# Patient Record
Sex: Female | Born: 1945 | Race: White | Hispanic: No | Marital: Married | Smoking: Former smoker
Health system: Southern US, Community
[De-identification: ages and names within clinical notes are randomized; demographics above are authoritative.]

## PROBLEM LIST (undated history)

## (undated) DIAGNOSIS — M810 Age-related osteoporosis without current pathological fracture: Secondary | ICD-10-CM

## (undated) DIAGNOSIS — I10 Essential (primary) hypertension: Secondary | ICD-10-CM

## (undated) DIAGNOSIS — E041 Nontoxic single thyroid nodule: Secondary | ICD-10-CM

## (undated) DIAGNOSIS — I341 Nonrheumatic mitral (valve) prolapse: Secondary | ICD-10-CM

## (undated) DIAGNOSIS — IMO0002 Reserved for concepts with insufficient information to code with codable children: Secondary | ICD-10-CM

## (undated) HISTORY — DX: Nonrheumatic mitral (valve) prolapse: I34.1

## (undated) HISTORY — PX: TUBAL LIGATION: SHX77

## (undated) HISTORY — PX: BREAST BIOPSY: SHX20

## (undated) HISTORY — DX: Essential (primary) hypertension: I10

## (undated) HISTORY — DX: Nontoxic single thyroid nodule: E04.1

## (undated) HISTORY — DX: Age-related osteoporosis without current pathological fracture: M81.0

## (undated) HISTORY — PX: OTHER SURGICAL HISTORY: SHX169

## (undated) HISTORY — DX: Reserved for concepts with insufficient information to code with codable children: IMO0002

---

## 1979-08-30 HISTORY — PX: VAGINAL HYSTERECTOMY: SUR661

## 2000-06-15 DIAGNOSIS — IMO0002 Reserved for concepts with insufficient information to code with codable children: Secondary | ICD-10-CM

## 2000-06-15 HISTORY — DX: Reserved for concepts with insufficient information to code with codable children: IMO0002

## 2003-02-08 ENCOUNTER — Encounter: Payer: Self-pay | Admitting: Neurosurgery

## 2003-02-10 ENCOUNTER — Inpatient Hospital Stay (HOSPITAL_COMMUNITY): Admission: RE | Admit: 2003-02-10 | Discharge: 2003-02-13 | Payer: Self-pay | Admitting: Neurosurgery

## 2003-02-10 ENCOUNTER — Encounter: Payer: Self-pay | Admitting: Neurosurgery

## 2003-12-28 ENCOUNTER — Emergency Department (HOSPITAL_COMMUNITY): Admission: EM | Admit: 2003-12-28 | Discharge: 2003-12-29 | Payer: Self-pay | Admitting: Emergency Medicine

## 2006-12-29 LAB — HM COLONOSCOPY: HM Colonoscopy: NORMAL

## 2008-02-28 ENCOUNTER — Emergency Department (HOSPITAL_COMMUNITY): Admission: EM | Admit: 2008-02-28 | Discharge: 2008-02-28 | Payer: Self-pay | Admitting: Family Medicine

## 2008-12-29 LAB — HM DEXA SCAN: HM Dexa Scan: NORMAL

## 2010-07-29 LAB — HM MAMMOGRAPHY: HM Mammogram: NORMAL

## 2011-05-16 NOTE — Op Note (Signed)
Tammy Bowen, BRAZZLE                      ACCOUNT NO.:  [id number]   MEDICAL RECORD NO.:  [id number]                   PATIENT TYPE:  INP   LOCATION:  2858                                 FACILITY:  [facility]   PHYSICIAN:  Danae Orleans. Venetia Maxon, M.D.               DATE OF BIRTH:  01-Jun-1946   DATE OF PROCEDURE:  02/10/2003  DATE OF DISCHARGE:                                 OPERATIVE REPORT   PREOPERATIVE DIAGNOSES:  Recurrent disk herniation L5-S1, left with  spondylolisthesis, degenerative disk disease and lumbar radiculopathy.   POSTOPERATIVE DIAGNOSES:  Recurrent disk herniation L5-S1, left with  spondylolisthesis, degenerative disk disease and lumbar radiculopathy.   PROCEDURE:  1. Redo diskectomy L5-S1.  2. Transverse lumbar interbody fusion L5-S1 with 9 mm Synthes bone allograft     and morcellized autograft.  3. Pedicle screw fixation L5-S1 bilaterally.  4. Posterolateral arthrodesis with morcellized autograft and Vitoss.   SURGEON:  Danae Orleans. Venetia Maxon, M.D.   ASSISTANT:  Clydene Fake, M.D.   ANESTHESIA:  General endotracheal.   ESTIMATED BLOOD LOSS:  150 cc.   COMPLICATIONS:  None.   DISPOSITION:  Recovery.   INDICATIONS FOR PROCEDURE:  Marnette Perkins is a 65 year old woman with a  recurrent disk herniation at L5-S1 on the left with marked spondylosis,  retrolisthesis of L5-S1 which increases between flexion and extension views.  It was elected to take her to surgery for decompression and fusion.   DESCRIPTION OF PROCEDURE:  Ms. Hemmelgarn is brought to the operating room.  Following the satisfactory and uncomplicated induction of general  endotracheal anesthesia and placement of intravenous lines and Foley  catheter, she was placed in a prone position on the operating room table  were the low back was then prepped and draped in the usual sterile fashion.  The area of the planned incision was infiltrated with 0.25% Marcaine and  0.5% lidocaine with 1:200,000  epinephrine. An incision was made over the  previous incision, extended cephalad and caudad. Old [place] sutures were  removed, bilateral subperiosteal dissection was performed exposing the L5-S1  interspace bilaterally as well as the transverse processes of L5 and S1.  Intraoperative x-ray confirmed correct orientation. Self retaining  retractors placed to facilitate exposure. A total laminectomy at L5 was then  performed decompressing the L5 nerve root and the lateral thecal sac which  was densely scarred in. Decompression was done under loupe magnification.  There was a large amount of free fragment of disk material directly abutting  the L5 nerve root and the lateral edge of the thecal sac causing significant  nerve root compression. This material was removed resulting in significant  decompression of the nerve roots but with removal of the spondylitic  overgrown bone. A partial laminectomy was performed on the right and a  laminar spreader was then placed and subsequently a more complete diskectomy  was performed on the left and using intraoperative fluoroscopy, a 9  mm  Synthes transverse lumbar interbody bone graft was inserted and counter sunk  appropriately. Then additional morcellized bone autograft was placed  overlying the skin plant and was tamped into position. Subsequently pedicle  screws were placed, two at L5 and two at S1 using AP and lateral fluoroscopy  to confirm trajectories. A 40 mm x 5.75 mm screws were placed at S1 and 45  mm x 7.5 mm SD-90 screws were placed at L5. All screws had excellent  purchase and no lateral cut outs were identified. The morcellized bone  autograft along with Vitoss had mixed with bone and blood aspirate from the  drilling and pedicles. This was then inserted in the posterolateral region  overlying the decorticated transverse processes of L5 and sacral ala  bilaterally. Hemostasis was then assured. The self retaining retractor was  removed. The  lumbodorsal fascia was closed with #1 Vicryl suture, the  subcutaneous tissue was reapproximated with 2-0 Vicryl interrupted inverted  sutures and skin edges reapproximated with interrupted 3-0 Vicryl stitches.  The wound was dressed with Benzoin and Steri-Strips, Telfa, gauze and tape.  The patient was extubated in the operating room and taken to the recovery  room in stable satisfactory condition having tolerated the operation well.  Counts were correct at the end of the case.                                               Danae Orleans. Venetia Maxon, M.D.    JDS/MEDQ  D:  02/10/2003  T:  02/10/2003  Job:  [id number]

## 2011-05-16 NOTE — Discharge Summary (Signed)
   NAMEDAWNYEL, LEVEN                      ACCOUNT NO.:  [id number]   MEDICAL RECORD NO.:  [id number]                   PATIENT TYPE:  INP   LOCATION:  3040                                 FACILITY:  [facility]   PHYSICIAN:  Danae Orleans. Venetia Maxon, M.D.               DATE OF BIRTH:  [birth year]   DATE OF ADMISSION:  02/10/2003  DATE OF DISCHARGE:  02/13/2003                                 DISCHARGE SUMMARY   REASON FOR ADMISSION:  Lumbar disk herniation, lumbar spondylolisthesis,  degenerative disk disease, recurrent disk herniation, and mitral valve  prolapse.   FINAL DIAGNOSES:  Lumbar disk herniation, lumbar spondylolisthesis,  degenerative disk disease, recurrent disk herniation, and mitral valve  prolapse.   HOSPITAL COURSE:  The patient is a 65 year old woman with recurrent disk  herniation at L5-S1 on the left with spondylolisthesis, degenerative disk  disease, and lumbar radiculopathy. She is admitted for same-day procedure  basis and underwent decompression and fusion at the L5-S1 levels.  Postoperatively, she did well with significant reduction in pain. She was  gradually mobilized after surgery and was eventually discharged home in  stable and satisfactory condition having tolerated her operation and  hospitalization well.   DISCHARGE MEDICATIONS:  Percocet one to two every four hours as needed for  pain, Paxil 10 mg p.o. every eight hours as needed for spasms.   FOLLOW UP:  Follow up with Dr. Venetia Maxon in three weeks.                                               Danae Orleans. Venetia Maxon, M.D.    JDS/MEDQ  D:  03/10/2003  T:  03/11/2003  Job:  [id number]

## 2013-05-05 ENCOUNTER — Encounter: Payer: Self-pay | Admitting: *Deleted

## 2013-05-09 ENCOUNTER — Encounter: Payer: Self-pay | Admitting: Obstetrics and Gynecology

## 2013-05-09 ENCOUNTER — Ambulatory Visit (INDEPENDENT_AMBULATORY_CARE_PROVIDER_SITE_OTHER): Payer: Medicare Other | Admitting: Obstetrics and Gynecology

## 2013-05-09 VITALS — BP 122/70 | Ht 62.0 in | Wt 111.0 lb

## 2013-05-09 DIAGNOSIS — Z01419 Encounter for gynecological examination (general) (routine) without abnormal findings: Secondary | ICD-10-CM

## 2013-05-09 DIAGNOSIS — Z124 Encounter for screening for malignant neoplasm of cervix: Secondary | ICD-10-CM

## 2013-05-09 MED ORDER — ESTRADIOL 0.1 MG/GM VA CREA: TOPICAL_CREAM | VAGINAL | Status: DC

## 2013-05-09 NOTE — Addendum Note (Signed)
Addended by: Alison Murray on: 05/09/2013 01:55 PM   Modules accepted: Orders

## 2013-05-09 NOTE — Progress Notes (Addendum)
67 y.o.  Married  Caucasian female   G3P0011 here for annual exam.  Using #3 ring with support for cystocele. Takes it out every night and really only wears it during part of the day.  Is very happy with the relief she gets from the pelvic pressure when she uses the pessary.    No LMP recorded. Patient is postmenopausal.          Sexually active: yes  The current method of family planning is tubal ligation, status post hysterectomy and post menopausal status.    Exercising: walking Last mammogram:  07/29/12 normal Last pap smear: not sure History of abnormal pap: no Smoking:quit 40 years ago Alcohol: 1 glass of wine at night, occ beer Last colonoscopy:2008 normal repeat in 10years Last Bone Density:  12/30/11 osteopenia Last tetanus shot: not sure done at Dr. Trey Sailors office Last cholesterol check: 2013 normal  Hgb:    pcp           Urine: pcp    Health Maintenance  Topic Date Due  . Tetanus/tdap  10/22/1965  . Zostavax  10/22/2006  . Pneumococcal Polysaccharide Vaccine Age 19 And Over  10/23/2011  . Mammogram  07/29/2012  . Influenza Vaccine  08/29/2013  . Colonoscopy  12/29/2016    Family History  Problem Relation Age of Onset  . Thyroid disease Sister   . Hypertension Maternal Aunt     There are no active problems to display for this patient.   Past Medical History  Diagnosis Date  . Hypertension   . MVP (mitral valve prolapse)   . Cystocele 06/15/00    Past Surgical History  Procedure Laterality Date  . Vaginal hysterectomy  1980's    Prolapsa  . Back fusion  2004, 1990    lumbar  . Breast biopsy Left   . Tubal ligation      BTSP    Allergies: Review of patient's allergies indicates no known allergies.  Current Outpatient Prescriptions  Medication Sig Dispense Refill  . atenolol (TENORMIN) 50 MG tablet Take 50 mg by mouth daily.      . benazepril-hydrochlorthiazide (LOTENSIN HCT) 10-12.5 MG per tablet Take 1 tablet by mouth daily.      Marland Kitchen CALCIUM PO  Take 800 mg by mouth daily.       . Cholecalciferol (VITAMIN D PO) Take 2,000 Units by mouth daily.      Marland Kitchen estradiol (VIVELLE-DOT) 0.05 MG/24HR Place 2 patches onto the skin once a week.       No current facility-administered medications for this visit.    ROS: Pertinent items are noted in HPI.  Social Hx:  Married, two children, 4 grandchildren.  One child in [place], on in [place].  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    Exam:    BP 122/70  Ht 5\' 2"  (1.575 m)  Wt 111 lb (50.349 kg)  BMI 20.3 kg/m2   Wt Readings from Last 3 Encounters:  05/09/13 111 lb (50.349 kg)     Ht Readings from Last 3 Encounters:  05/09/13 5\' 2"  (1.575 m)    General appearance: alert, cooperative and appears stated age Head: Normocephalic, without obvious abnormality, atraumatic Neck: no adenopathy, supple, symmetrical, trachea midline and thyroid not enlarged, symmetric, no tenderness/mass/nodules Lungs: clear to auscultation bilaterally Breasts: Inspection negative, No nipple retraction or dimpling, No nipple discharge or bleeding, No axillary or supraclavicular adenopathy, Normal to palpation without dominant masses Heart:  regular rate and rhythm Abdomen: soft, non-tender; bowel sounds normal; no masses,  no organomegaly Extremities: extremities normal, atraumatic, no cyanosis or edema Skin: Skin color, texture, turgor normal. No rashes or lesions Lymph nodes: Cervical, supraclavicular, and axillary nodes normal. No abnormal inguinal nodes palpated Neurologic: Grossly normal   Pelvic: External genitalia:  no lesions              Urethra:  normal appearing urethra with no masses, tenderness or lesions              Bartholins and Skenes: normal                 Vagina: normal appearing vagina with normal color and discharge, no lesions, no excoriations from pessary.  Pessary removed for exam and then reinserted.  Cystocele not prominent on this exam.                 Cervix: absent              Pap taken: no        Bimanual Exam:  Uterus:  absent                                      Adnexa: normal adnexa in size, nontender and no masses                                      Rectovaginal: Confirms                                      Anus:  normal sphincter tone, no lesions  A: normal menopausal exam, on ERT     Cystocele, using #3 ring with support very succussfully     S/p [facility]     P: mammogram counseled on breast self exam, mammography screening, adequate intake of calcium and vitamin D, diet and exercise return annually or prn   Cont E2 orally 0.5 mg - gets rx from Dr. Andrey Campanile Will change premarin cream to Estrace Cream due to insurance - use with pessary, 1/2 g tiw rx sent to [facility]  An After Visit Summary was printed and given to the patient.

## 2014-05-10 ENCOUNTER — Ambulatory Visit: Payer: Medicare Other | Admitting: Obstetrics and Gynecology

## 2014-05-10 ENCOUNTER — Ambulatory Visit (INDEPENDENT_AMBULATORY_CARE_PROVIDER_SITE_OTHER): Payer: Medicare Other | Admitting: Obstetrics and Gynecology

## 2014-05-10 ENCOUNTER — Encounter: Payer: Self-pay | Admitting: Obstetrics and Gynecology

## 2014-05-10 VITALS — BP 124/80 | HR 70 | Ht 62.5 in | Wt 109.4 lb

## 2014-05-10 DIAGNOSIS — IMO0002 Reserved for concepts with insufficient information to code with codable children: Secondary | ICD-10-CM

## 2014-05-10 DIAGNOSIS — M949 Disorder of cartilage, unspecified: Secondary | ICD-10-CM

## 2014-05-10 DIAGNOSIS — Z01419 Encounter for gynecological examination (general) (routine) without abnormal findings: Secondary | ICD-10-CM

## 2014-05-10 DIAGNOSIS — Z0000 Encounter for general adult medical examination without abnormal findings: Secondary | ICD-10-CM

## 2014-05-10 DIAGNOSIS — M8580 Other specified disorders of bone density and structure, unspecified site: Secondary | ICD-10-CM

## 2014-05-10 DIAGNOSIS — I10 Essential (primary) hypertension: Secondary | ICD-10-CM

## 2014-05-10 DIAGNOSIS — N8111 Cystocele, midline: Secondary | ICD-10-CM

## 2014-05-10 DIAGNOSIS — Z124 Encounter for screening for malignant neoplasm of cervix: Secondary | ICD-10-CM

## 2014-05-10 DIAGNOSIS — M899 Disorder of bone, unspecified: Secondary | ICD-10-CM

## 2014-05-10 LAB — HEMOGLOBIN, FINGERSTICK: Hemoglobin, fingerstick: 12.1 g/dL (ref 12.0–16.0)

## 2014-05-10 NOTE — Progress Notes (Signed)
Patient ID: Tammy Bowen, female   DOB: 22-Aug-1946, 68 y.o.   MRN: [id number] GYNECOLOGY VISIT  PCP:   Kathryne Eriksson, MD  Referring provider:   HPI: 68 y.o.   Married  Caucasian  female   G3P0011 with Patient's last menstrual period was 12/30/1983.   here for  AEX. Had a hysterectomy for prolapse.  Uses pessary #3 ring with support for cystocele. Pessary working well.  Little urinary incontinence if sneezes or jogging. No real Kegel exercises. No pad use.   Using Estrace vaginal cream infrequently and takes oral Estrace.  Oral Estrace is from Dr. Redmond Pulling.  Has refills. Does not need refills or Estrace cream.  Using KY jelly.  No real hot flashes.  Hgb:    12.1 Urine:  -  GYNECOLOGIC HISTORY: Patient's last menstrual period was 12/30/1983. Sexually active:  yes Partner preference: female Contraception:  Hysterectomy  Menopausal hormone therapy: Estradiol , Estrace cream DES exposure:  no  Blood transfusions:  Uncertain--may have received blood transfusion with a surgery. Sexually transmitted diseases:  no  GYN procedures and prior surgeries:  Hysterectomy for prolapse, D & C due to miscarriage Last mammogram:  2013 PPI:RJJOA.             Last pap and high risk HPV testing:  unsure  History of abnormal pap smear:  no   OB History   Grav Para Term Preterm Abortions TAB SAB Ect Mult Living   3 2   1 1    1        LIFESTYLE: Exercise:   Yoga and walking            Tobacco:   no Alcohol:    7 drinks per week Drug use:  no  OTHER HEALTH MAINTENANCE: Tetanus/TDap:   Up to date through PCP Gardisil:              n/a Influenza:            Never d/t allergy to Thimerosal Zostavax:            Up to date with PCP  Bone density:     12/2011 Osteopenia:[facility] Colonoscopy:     2008 wnl Dr. Benson [place]:  Next colonoscopy due 2018.  Cholesterol check:  1-2 year ago wnl.  Family History  Problem Relation Age of Onset  . Thyroid disease Sister   . Hypertension Maternal Aunt   .  Stroke Mother     There are no active problems to display for this patient.  Past Medical History  Diagnosis Date  . Hypertension   . MVP (mitral valve prolapse)   . Cystocele 06/15/00    Past Surgical History  Procedure Laterality Date  . Vaginal hysterectomy  1980's    Prolapsa  . Back fusion  2004, 1990    lumbar  . Breast biopsy Left   . Tubal ligation      BTSP    ALLERGIES: Thimerosal  Current Outpatient Prescriptions  Medication Sig Dispense Refill  . atenolol (TENORMIN) 50 MG tablet Take 50 mg by mouth daily.      . benazepril-hydrochlorthiazide (LOTENSIN HCT) 10-12.5 MG per tablet Take 1 tablet by mouth daily.      Marland Kitchen CALCIUM PO Take 800 mg by mouth daily.       . Cholecalciferol (VITAMIN D PO) Take 2,000 Units by mouth daily.      Marland Kitchen estradiol (ESTRACE) 0.1 MG/GM vaginal cream Use 1/2 g vaginally every night for the first 2  weeks, then use 1/2 g vaginally two or three times per week  42.5 g  6  . estradiol (ESTRACE) 0.5 MG tablet Take 0.5 mg by mouth daily.       No current facility-administered medications for this visit.     ROS:  Pertinent items are noted in HPI.  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   PHYSICAL EXAMINATION:    BP 124/80  Pulse 70  Ht 5' 2.5" (1.588 m)  Wt 109 lb 6.4 oz (49.624 kg)  BMI 19.68 kg/m2  LMP 12/30/1983   Wt Readings from Last 3 Encounters:  05/10/14 109 lb 6.4 oz (49.624 kg)  05/09/13 111 lb (50.349 kg)     Ht Readings from Last 3 Encounters:  05/10/14 5' 2.5" (1.588 m)  05/09/13 5\' 2"  (1.575 m)    General appearance: alert, cooperative and appears stated age Head: Normocephalic, without obvious abnormality, atraumatic Neck: no adenopathy, supple, symmetrical, trachea midline and thyroid not enlarged, symmetric, no tenderness/mass/nodules Lungs: clear to auscultation bilaterally Breasts: Inspection negative, No nipple retraction or dimpling, No nipple discharge or bleeding, No axillary or  supraclavicular adenopathy, Normal to palpation without dominant masses Heart: regular rate and rhythm Abdomen: soft, non-tender; no masses,  no organomegaly Extremities: extremities normal, atraumatic, no cyanosis or edema Skin: Skin color, texture, turgor normal. No rashes or lesions Lymph nodes: Cervical, supraclavicular, and axillary nodes normal. No abnormal inguinal nodes palpated Neurologic: Grossly normal  Pelvic: External genitalia:  no lesions              Urethra:  normal appearing urethra with no masses, tenderness or lesions              Bartholins and Skenes: normal                 Vagina: normal appearing vagina with normal color and discharge, no lesions, third degree cystocele, first degree vault prolapse, first degree rectocele              Cervix: absent              Pap and high risk HPV testing done: no.            Bimanual Exam:  Uterus:  uterus is normal size, shape, consistency and nontender                                      Adnexa: normal adnexa in size, nontender and no masses                                      Rectovaginal: Confirms                                      Anus:  normal sphincter tone, no lesions  Pessary removed, cleansed, replaced.  ASSESSMENT  Normal gynecologic exam. Pelvic organ prolapse. Pessary use. Estrogen therapy patient.   PLAN  Mammogram recommended yearly.  Patient overdue and will schedule with [facility].  Will schedule osteopenia for [facility] also.  Pap smear and high risk HPV testing not indicated.  Continue pessary use.  Discussion of prolapse and ACOG handout on prolapse.  I discussed estrogen therapy - benefits and risks including breast cancer,  DVT, PE, MI, Stroke.  She will get mammogram and bone density done and will consider weaning off. Counseled on self breast exam, Calcium and vitamin D intake, exercise. Return annually or prn   An After Visit Summary was printed and given to the patient.

## 2014-05-10 NOTE — Patient Instructions (Signed)

## 2014-05-11 LAB — COMPREHENSIVE METABOLIC PANEL
ALK PHOS: 44 U/L (ref 39–117)
ALT: 13 U/L (ref 0–35)
AST: 21 U/L (ref 0–37)
Albumin: 3.9 g/dL (ref 3.5–5.2)
BILIRUBIN TOTAL: 0.4 mg/dL (ref 0.2–1.2)
BUN: 18 mg/dL (ref 6–23)
CO2: 29 meq/L (ref 19–32)
Calcium: 8.8 mg/dL (ref 8.4–10.5)
Chloride: 102 mEq/L (ref 96–112)
Creat: 0.93 mg/dL (ref 0.50–1.10)
Glucose, Bld: 95 mg/dL (ref 70–99)
Potassium: 4.2 mEq/L (ref 3.5–5.3)
SODIUM: 139 meq/L (ref 135–145)
TOTAL PROTEIN: 7.1 g/dL (ref 6.0–8.3)

## 2014-05-11 LAB — CBC
HCT: 37.5 % (ref 36.0–46.0)
Hemoglobin: 12.5 g/dL (ref 12.0–15.0)
MCH: 29.6 pg (ref 26.0–34.0)
MCHC: 33.3 g/dL (ref 30.0–36.0)
MCV: 88.7 fL (ref 78.0–100.0)
PLATELETS: 265 10*3/uL (ref 150–400)
RBC: 4.23 MIL/uL (ref 3.87–5.11)
RDW: 14.3 % (ref 11.5–15.5)
WBC: 4.5 10*3/uL (ref 4.0–10.5)

## 2014-05-11 LAB — LIPID PANEL
Cholesterol: 200 mg/dL (ref 0–200)
HDL: 75 mg/dL (ref >39)
LDL CALC: 100 mg/dL — AB (ref 0–99)
Total CHOL/HDL Ratio: 2.7 Ratio
Triglycerides: 124 mg/dL (ref <150)
VLDL: 25 mg/dL (ref 0–40)

## 2014-05-12 LAB — TSH: TSH: 2.381 u[IU]/mL (ref 0.350–4.500)

## 2014-08-24 ENCOUNTER — Telehealth: Payer: Self-pay

## 2014-08-24 NOTE — Telephone Encounter (Signed)
Called patient and notified received BMD results.  Still will osteoporosis but stable since last study.  Will repeat in 2 years.  Per Dr. Quincy Simmonds continue with current therapy: Estrogen, calcium/vit.D/exercise.

## 2014-09-11 ENCOUNTER — Encounter: Payer: Self-pay | Admitting: Obstetrics and Gynecology

## 2014-10-30 ENCOUNTER — Encounter: Payer: Self-pay | Admitting: Obstetrics and Gynecology

## 2014-11-06 ENCOUNTER — Telehealth: Payer: Self-pay | Admitting: Obstetrics and Gynecology

## 2014-11-06 NOTE — Telephone Encounter (Signed)
Routing to Dr. Silva to review and advise.  

## 2014-11-06 NOTE — Telephone Encounter (Signed)
Pt states Dr. Quincy Simmonds told her she would give her some recommendations for her and her husband  for a primary care physician.

## 2014-11-06 NOTE — Telephone Encounter (Signed)
I may have mentioned [employer] as a possibility.

## 2014-11-09 NOTE — Telephone Encounter (Signed)
Called patient and information given about [facility] and number given.  Will close encounter.

## 2015-05-16 ENCOUNTER — Ambulatory Visit: Payer: Medicare Other | Admitting: Obstetrics and Gynecology

## 2015-05-25 ENCOUNTER — Encounter: Payer: Self-pay | Admitting: Obstetrics and Gynecology

## 2015-05-25 ENCOUNTER — Ambulatory Visit (INDEPENDENT_AMBULATORY_CARE_PROVIDER_SITE_OTHER): Payer: Medicare Other | Admitting: Obstetrics and Gynecology

## 2015-05-25 VITALS — BP 120/70 | HR 48 | Resp 14 | Ht 62.5 in | Wt 108.0 lb

## 2015-05-25 DIAGNOSIS — Z124 Encounter for screening for malignant neoplasm of cervix: Secondary | ICD-10-CM | POA: Diagnosis not present

## 2015-05-25 DIAGNOSIS — Z01419 Encounter for gynecological examination (general) (routine) without abnormal findings: Secondary | ICD-10-CM

## 2015-05-25 DIAGNOSIS — R319 Hematuria, unspecified: Secondary | ICD-10-CM

## 2015-05-25 DIAGNOSIS — Z0000 Encounter for general adult medical examination without abnormal findings: Secondary | ICD-10-CM

## 2015-05-25 LAB — POCT URINALYSIS DIPSTICK
Bilirubin, UA: NEGATIVE
GLUCOSE UA: NEGATIVE
Ketones, UA: NEGATIVE
LEUKOCYTES UA: NEGATIVE
Nitrite, UA: NEGATIVE
PH UA: 6
Protein, UA: NEGATIVE
Spec Grav, UA: 1.015
Urobilinogen, UA: NEGATIVE

## 2015-05-25 MED ORDER — ESTRADIOL 0.1 MG/GM VA CREA: TOPICAL_CREAM | VAGINAL | Status: DC

## 2015-05-25 NOTE — Patient Instructions (Signed)

## 2015-05-25 NOTE — Progress Notes (Signed)
Patient ID: Tammy Bowen, female   DOB: [birth year], 69 y.o.   MRN: [id number] 68 y.o. G27P0011 Married Caucasian female here for annual exam.    Has stable osteoporosis by bone density in 2015.   Has microscopic hematuria today.  Has pessary in place. No problems with it.  Removes every few days.  Has had work up in the past for UTIs.  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   Patient is on ERT.  Worried about stroke and broken bones.  Hesitant to take bisphonates.  Takes Estrace 0.5 mg and Estrace vaginal cream.   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   PCP:   Dr. Kathryne Eriksson  Patient's last menstrual period was 12/30/1983.          Sexually active: Yes.    The current method of family planning is diaphragm and status post hysterectomy.    Exercising: No.  The patient does not participate in regular exercise at present. Smoker:  no  Health Maintenance: Pap: Hysterectomy  History of abnormal Pap:  no MMG:  08-21-14 WNL Fibroglandular density- [facility] Colonoscopy: 2008 WNL repeat in 10 years BMD:   08-21-14 osteoporisis -stable TDaP: 2014  Screening Labs:  Hb today:   Urine today: ++ RBC   reports that she quit smoking about 42 years ago. Her smoking use included Cigars. She has never used smokeless tobacco. She reports that she drinks about 3.5 oz of alcohol per week. She reports that she does not use illicit drugs.  Past Medical History  Diagnosis Date  . Hypertension   . MVP (mitral valve prolapse)   . Cystocele 06/15/00    Past Surgical History  Procedure Laterality Date  . Vaginal hysterectomy  1980's    Prolapsa  . Back fusion  2004, 1990    lumbar  . Breast biopsy Left   . Tubal ligation      BTSP    Current Outpatient Prescriptions  Medication Sig Dispense Refill  . atenolol (TENORMIN) 50 MG tablet Take 50 mg by mouth daily.    . benazepril-hydrochlorthiazide (LOTENSIN HCT) 10-12.5 MG per tablet Take 1  tablet by mouth daily.    Marland Kitchen CALCIUM PO Take 800 mg by mouth daily.     . Cholecalciferol (VITAMIN D PO) Take 2,000 Units by mouth daily.    Marland Kitchen estradiol (ESTRACE) 0.1 MG/GM vaginal cream Use 1/2 g vaginally every night for the first 2 weeks, then use 1/2 g vaginally two or three times per week 42.5 g 6  . estradiol (ESTRACE) 0.5 MG tablet Take 0.5 mg by mouth daily.     No current facility-administered medications for this visit.    Family History  Problem Relation Age of Onset  . Thyroid disease Sister   . Hypertension Maternal Aunt   . Stroke Mother     ROS:  Pertinent items are noted in HPI.  Otherwise, a comprehensive ROS was negative.  Exam:   BP 120/70 mmHg  Pulse 48  Resp 14  Ht 5' 2.5" (1.588 m)  Wt 108 lb (48.988 kg)  BMI 19.43 kg/m2  LMP 12/30/1983    General appearance: alert, cooperative and appears stated age Head: Normocephalic, without obvious abnormality, atraumatic Neck: no adenopathy, supple, symmetrical, trachea midline and thyroid normal to inspection and palpation Lungs: clear to auscultation bilaterally Breasts: normal appearance, no masses or tenderness, Inspection negative, No nipple retraction or dimpling, No nipple discharge  or bleeding, No axillary or supraclavicular adenopathy Heart: regular rate and rhythm Abdomen: soft, non-tender; bowel sounds normal; no masses,  no organomegaly Extremities: extremities normal, atraumatic, no cyanosis or edema Skin: Skin color, texture, turgor normal. No rashes or lesions Lymph nodes: Cervical, supraclavicular, and axillary nodes normal. No abnormal inguinal nodes palpated Neurologic: Grossly normal  Pelvic: External genitalia:  no lesions              Urethra:  normal appearing urethra with no masses, tenderness or lesions              Bartholins and Skenes: normal                 Vagina: normal appearing vagina with normal color and discharge, no lesions.  No significant prolapse noted.  No ulceration. Minimal  white discharge.              Cervix: absent              Pap taken: No. Bimanual Exam:  Uterus:  uterus absent              Adnexa: no mass, fullness, tenderness              Rectovaginal: Yes.  .  Confirms.              Anus:  normal sphincter tone, no lesions  Pessary removed, cleansed, and replaced.   Chaperone was present for exam.  Assessment:   Well woman visit with normal exam. Microscopic hematuria. Pelvic organ prolapse. Pessary patient.  Status post total vaginal hysterectomy.  ERT.  Osteoporosis.   Plan: Yearly mammogram recommended after age 25.  Recommended self breast exam.  Pap and HR HPV as above. Discussed Calcium, Vitamin D, regular exercise program including cardiovascular and weight bearing exercise. Labs performed.  Yes.  .   See orders.  Check urine micro and culture.  Refills given on medications.  Yes.  .  See orders.  Will continue with vaginal estrogen cream.  Discussed risks of DVT, PE, MI, stroke, breast cancer.   She may stop oral estrogen.  Will discuss also with PCP.  No refills given at this time. Will continue pessary.  Declines medication for osteoporosis treatment.  Bone density in 2017.   Follow up annually and prn.     After visit summary provided.

## 2015-05-26 LAB — URINALYSIS, MICROSCOPIC ONLY
Bacteria, UA: NONE SEEN
Casts: NONE SEEN
Crystals: NONE SEEN
SQUAMOUS EPITHELIAL / LPF: NONE SEEN

## 2015-05-27 LAB — URINE CULTURE
Colony Count: NO GROWTH
ORGANISM ID, BACTERIA: NO GROWTH

## 2015-05-29 ENCOUNTER — Telehealth: Payer: Self-pay | Admitting: Obstetrics and Gynecology

## 2015-05-29 NOTE — Telephone Encounter (Signed)
Spoke with patient and message from Dr. Quincy Simmonds given. Patient agreeable. She will follow up prn. Routing to provider for final review. Patient agreeable to disposition. Will close encounter.

## 2015-05-29 NOTE — Telephone Encounter (Signed)
-----   Message from Nunzio Cobbs, MD sent at 05/27/2015 12:54 PM EDT ----- Please inform patient of negative and normal urine micro and culture. The urine dip in the office was a false positive.  She requested a phone call instead of a My Chart message.  Cc- Marisa Sprinkles

## 2015-05-29 NOTE — Telephone Encounter (Signed)
Patient was told to call today for results from urine culture results done 05/25/15.

## 2016-04-24 DIAGNOSIS — M21969 Unspecified acquired deformity of unspecified lower leg: Secondary | ICD-10-CM | POA: Insufficient documentation

## 2016-04-24 DIAGNOSIS — K2100 Gastro-esophageal reflux disease with esophagitis, without bleeding: Secondary | ICD-10-CM | POA: Insufficient documentation

## 2016-04-24 DIAGNOSIS — I059 Rheumatic mitral valve disease, unspecified: Secondary | ICD-10-CM | POA: Insufficient documentation

## 2016-04-24 DIAGNOSIS — M1990 Unspecified osteoarthritis, unspecified site: Secondary | ICD-10-CM | POA: Insufficient documentation

## 2016-04-24 DIAGNOSIS — I10 Essential (primary) hypertension: Secondary | ICD-10-CM | POA: Insufficient documentation

## 2016-04-25 ENCOUNTER — Other Ambulatory Visit: Payer: Self-pay | Admitting: Family Medicine

## 2016-04-25 DIAGNOSIS — E041 Nontoxic single thyroid nodule: Secondary | ICD-10-CM

## 2016-04-25 DIAGNOSIS — M8580 Other specified disorders of bone density and structure, unspecified site: Secondary | ICD-10-CM | POA: Insufficient documentation

## 2016-04-25 DIAGNOSIS — M67471 Ganglion, right ankle and foot: Secondary | ICD-10-CM | POA: Insufficient documentation

## 2016-05-06 ENCOUNTER — Ambulatory Visit
Admission: RE | Admit: 2016-05-06 | Discharge: 2016-05-06 | Disposition: A | Discharge status: Home / Self Care | Payer: Medicare Other | Source: Ambulatory Visit | Attending: Family Medicine | Admitting: Family Medicine

## 2016-05-06 DIAGNOSIS — E041 Nontoxic single thyroid nodule: Secondary | ICD-10-CM

## 2016-05-06 HISTORY — DX: Nontoxic single thyroid nodule: E04.1

## 2016-05-13 ENCOUNTER — Other Ambulatory Visit: Payer: Self-pay | Admitting: Family Medicine

## 2016-05-13 DIAGNOSIS — E041 Nontoxic single thyroid nodule: Secondary | ICD-10-CM

## 2016-05-21 ENCOUNTER — Other Ambulatory Visit: Payer: Self-pay | Admitting: Family Medicine

## 2016-05-21 DIAGNOSIS — E041 Nontoxic single thyroid nodule: Secondary | ICD-10-CM

## 2016-06-04 ENCOUNTER — Ambulatory Visit
Admission: RE | Admit: 2016-06-04 | Discharge: 2016-06-04 | Disposition: A | Discharge status: Home / Self Care | Payer: Medicare Other | Source: Ambulatory Visit | Attending: Family Medicine | Admitting: Family Medicine

## 2016-06-04 ENCOUNTER — Other Ambulatory Visit (HOSPITAL_COMMUNITY)
Admission: RE | Admit: 2016-06-04 | Discharge: 2016-06-04 | Disposition: A | Discharge status: Home / Self Care | Payer: Medicare Other | Source: Ambulatory Visit | Attending: Radiology | Admitting: Radiology

## 2016-06-04 DIAGNOSIS — E041 Nontoxic single thyroid nodule: Secondary | ICD-10-CM | POA: Diagnosis present

## 2016-06-06 ENCOUNTER — Ambulatory Visit (INDEPENDENT_AMBULATORY_CARE_PROVIDER_SITE_OTHER): Payer: Medicare Other | Admitting: Obstetrics and Gynecology

## 2016-06-06 ENCOUNTER — Encounter: Payer: Self-pay | Admitting: Obstetrics and Gynecology

## 2016-06-06 VITALS — BP 138/72 | HR 64 | Resp 14 | Ht 62.25 in | Wt 108.2 lb

## 2016-06-06 DIAGNOSIS — Z79899 Other long term (current) drug therapy: Secondary | ICD-10-CM | POA: Diagnosis not present

## 2016-06-06 DIAGNOSIS — Z01419 Encounter for gynecological examination (general) (routine) without abnormal findings: Secondary | ICD-10-CM | POA: Diagnosis not present

## 2016-06-06 DIAGNOSIS — M810 Age-related osteoporosis without current pathological fracture: Secondary | ICD-10-CM | POA: Diagnosis not present

## 2016-06-06 DIAGNOSIS — Z124 Encounter for screening for malignant neoplasm of cervix: Secondary | ICD-10-CM

## 2016-06-06 DIAGNOSIS — IMO0002 Reserved for concepts with insufficient information to code with codable children: Secondary | ICD-10-CM

## 2016-06-06 DIAGNOSIS — N8110 Cystocele, unspecified: Secondary | ICD-10-CM

## 2016-06-06 DIAGNOSIS — N816 Rectocele: Secondary | ICD-10-CM | POA: Diagnosis not present

## 2016-06-06 MED ORDER — ESTRADIOL 0.5 MG PO TABS: 0.5000 mg | ORAL_TABLET | Freq: Every day | ORAL | Status: DC

## 2016-06-06 NOTE — Progress Notes (Signed)
Patient ID: Tammy Bowen, female   DOB: 1946-08-08, 70 y.o.   MRN: [id number] 70 y.o. G49P0011 Married Caucasian female here for annual exam.    Brings in labs today from her PCP that will be scanned in.  Also had recent thyroid biopsy, and results are pending.   Ultrasound showed bilateral thyroid nodules. TSH normal.  Takes oral estrogen and uses estrogen cream (rarely)  Took pessary out today for this visit.  Leaves it in for a week at a time. Has some bleeding with pessary removal.   Has frequent urination.   Has osteoporosis and does not treat.  Due for a recheck on this.  Uses Fosamax many years ago.  Vit D 52 - 04/25/16.  Some shortness of breath with anxiety.  Son is deceased and concern about how his children are being raised.   Is now fully retired.   PCP:  Charlcie Cradle, MD   Patient's last menstrual period was 12/30/1983.           Sexually active: Yes.  female  The current method of family planning is status post hysterectomy.    Exercising: Yes.    walks 4days/week;1.5-27miles. Smoker:  no  Health Maintenance: Pap:  Unsure--Hysterectomy History of abnormal Pap:  no MMG:  08-21-14 Density B/Neg/biRads1:[facility] Colonoscopy:  2008 normal with Dr. Lenise Herald due 2018 BMD:   08-21-14  Result  Stable Osteoporosis:[facility] TDaP:  2014 Gardasil:   N/A  Screening Labs:  Hb today: PCP, Urine today: PCP   reports that she quit smoking about 43 years ago. Her smoking use included Cigars. She has never used smokeless tobacco. She reports that she drinks about 4.2 oz of alcohol per week. She reports that she does not use illicit drugs.  Past Medical History  Diagnosis Date  . Hypertension   . MVP (mitral valve prolapse)   . Cystocele 06/15/00  . Thyroid nodule 05/06/16    right and left thyroid nodule     Past Surgical History  Procedure Laterality Date  . Vaginal hysterectomy  1980's    Prolapsa  . Back fusion  2004, 1990    lumbar  . Breast biopsy Left   . Tubal  ligation      BTSP    Current Outpatient Prescriptions  Medication Sig Dispense Refill  . atenolol (TENORMIN) 50 MG tablet Take 50 mg by mouth daily.    . benazepril-hydrochlorthiazide (LOTENSIN HCT) 10-12.5 MG per tablet Take 1 tablet by mouth daily.    Marland Kitchen CALCIUM PO Take 800 mg by mouth daily.     . Cholecalciferol (VITAMIN D PO) Take 2,000 Units by mouth daily.    Marland Kitchen estradiol (ESTRACE) 0.1 MG/GM vaginal cream Use 1/2 g vaginally every night for the first 2 weeks, then use 1/2 g vaginally two or three times per week 42.5 g 2  . estradiol (ESTRACE) 0.5 MG tablet Take 0.5 mg by mouth daily.     No current facility-administered medications for this visit.    Family History  Problem Relation Age of Onset  . Thyroid disease Sister   . Hypertension Maternal Aunt   . Stroke Mother   . Breast cancer Other 44    ROS:  Pertinent items are noted in HPI.  Otherwise, a comprehensive ROS was negative.  Exam:   BP 138/72 mmHg  Pulse 64  Resp 14  Ht 5' 2.25" (1.581 m)  Wt 108 lb 3.2 oz (49.079 kg)  BMI 19.64 kg/m2  LMP 12/30/1983  General appearance: alert, cooperative and appears stated age Head: Normocephalic, without obvious abnormality, atraumatic Neck: no adenopathy, supple, symmetrical, trachea midline and thyroid normal to inspection and palpation Lungs: clear to auscultation bilaterally Breasts: normal appearance, no masses or tenderness, Inspection negative, No nipple retraction or dimpling, No nipple discharge or bleeding, No axillary or supraclavicular adenopathy Heart: regular rate and rhythm Abdomen: incisions:  Left inguinal, soft, non-tender; no masses, no organomegaly Extremities: extremities normal, atraumatic, no cyanosis or edema Skin: Skin color, texture, turgor normal. No rashes or lesions Lymph nodes: Cervical, supraclavicular, and axillary nodes normal. No abnormal inguinal nodes palpated Neurologic: Grossly normal  Pelvic: External genitalia:  no lesions               Urethra:  normal appearing urethra with no masses, tenderness or lesions              Bartholins and Skenes: normal                 Vagina: normal appearing vagina with normal color and discharge, no lesions.  Second degree cystocele and second degree rectocele.  Minimal vault prolapse.               Cervix: absent              Pap taken: No. Bimanual Exam:  Uterus:  uterus absent              Adnexa: normal adnexa and no mass, fullness, tenderness              Rectal exam: Yes.  .  Confirms.              Anus:  normal sphincter tone, no lesions  Chaperone was present for exam.  Assessment:   Well woman visit with normal exam. Pelvic organ prolapse. Pessary patient.  Status post total vaginal hysterectomy.  ERT.  Osteoporosis.  Status post recent thyroid biopsy. Has bilateral thyroid nodules. Shortness of breath. ? Etiology. Anxiety.  Plan: Yearly mammogram recommended after age 72.  Recommended self breast exam.  Pap and HR HPV as above. Discussed Calcium, Vitamin D, regular exercise program including cardiovascular and weight bearing exercise. Labs performed.  No. .    Prescription medication(s) given.  Yes.  .  See orders. I refilled Estrace 0.5 mg for one month only.  I discussed risks of DVT, PE, MI, stroke, and breast cancer.  Needs to update mammogram for further refills. She will get her mammogram up to date and also do her bone density.  She will contact her PCP regarding her shortness of breath/anxiety. Ok to continue pessary use. Follow up annually and prn.       After visit summary provided.

## 2016-06-24 ENCOUNTER — Encounter: Payer: Self-pay | Admitting: Obstetrics and Gynecology

## 2016-06-30 ENCOUNTER — Telehealth: Payer: Self-pay

## 2016-06-30 NOTE — Telephone Encounter (Signed)
Called patient to discuss BMD results per Dr. Quincy Simmonds.  Report still shows Osteoporosis.  Hips look stable(left hip actually a little better).  Spine looks improved and has Osteopenia as dx in this region.  Continue weight bearing exercise, calcium vitamin D and will repeat study in 2 years.  Patient thanked me for calling and said to thank Dr. Quincy Simmonds also. Report in to be scanned in [place] from [place].

## 2016-07-28 ENCOUNTER — Other Ambulatory Visit: Payer: Self-pay | Admitting: Obstetrics and Gynecology

## 2016-07-28 NOTE — Telephone Encounter (Signed)
Medication refill request: Estradiol   Last AEX:  06-06-16  Next AEX: 06-10-17  Last MMG (if hormonal medication request): 06-12-16 WNL Refill authorized: Please advise

## 2016-12-19 DIAGNOSIS — Z23 Encounter for immunization: Secondary | ICD-10-CM | POA: Insufficient documentation

## 2017-05-05 ENCOUNTER — Telehealth: Payer: Self-pay | Admitting: Obstetrics and Gynecology

## 2017-05-05 NOTE — Telephone Encounter (Signed)
Patient is having issues with her pessary.

## 2017-05-05 NOTE — Telephone Encounter (Signed)
Spoke with patient. Patient states she feels same pressure in bladder as before placement of pessary. Patient reports pessary still in place, unable to remove, bulging around. Patient states she is able to void, goes frequently. Reports occasional bleeding when pulling out pessary, no vaginal discharge or odor. Recommended OV for further evaluation, scheduled for 05/06/17 at 9:30am with Dr. Quincy Simmonds. Patient is agreeable to date and time.  Routing to provider for final review. Patient is agreeable to disposition. Will close encounter.

## 2017-05-06 ENCOUNTER — Ambulatory Visit (INDEPENDENT_AMBULATORY_CARE_PROVIDER_SITE_OTHER): Payer: Medicare Other | Admitting: Obstetrics and Gynecology

## 2017-05-06 ENCOUNTER — Encounter: Payer: Self-pay | Admitting: Obstetrics and Gynecology

## 2017-05-06 VITALS — BP 118/62 | HR 60 | Resp 16 | Wt 110.0 lb

## 2017-05-06 DIAGNOSIS — N8110 Cystocele, unspecified: Secondary | ICD-10-CM

## 2017-05-06 NOTE — Progress Notes (Signed)
GYNECOLOGY  VISIT   HPI: 71 y.o.   Married  Caucasian  female   G3P0011 with Patient's last menstrual period was 12/30/1983.   here for pessary problems. Per patient has had urinary pressure and discomfort for about 2 months. Worse at the end of the day per patient. Feels a bulge like before she had a pessary.  Some difficulty removing recently.  Now removing every few days with vaginal estrogen cream. Has worked well in general except for the las 2 - 3 months.  Some difficulty voiding.  Feel pressure to void and then cannot.  No dysuria.  No hematuria.  No having much urinary leakage like she did when she first started using it.  Can have a little bleeding with pessary removal. No vaginal discharge.  Able to have BMs.  Does use magnesium and calcium combination.   At last exam in 06/06/16, Second degree cystocele and second degree rectocele.  Minimal vault prolapse.   GYNECOLOGIC HISTORY: Patient's last menstrual period was 12/30/1983. Contraception:  Hysterectomy Menopausal hormone therapy:  Estrace Last mammogram:  06-12-16 3D Density B/Neg/BiRads1:[facility] Last pap smear:  Unsure        OB History    Gravida Para Term Preterm AB Living   3 2     1 1    SAB TAB Ectopic Multiple Live Births     1               There are no active problems to display for this patient.   Past Medical History:  Diagnosis Date  . Cystocele 06/15/00  . Hypertension   . MVP (mitral valve prolapse)   . Thyroid nodule 05/06/16   right and left thyroid nodule     Past Surgical History:  Procedure Laterality Date  . back fusion  2004, 1990   lumbar  . BREAST BIOPSY Left   . TUBAL LIGATION     BTSP  . VAGINAL HYSTERECTOMY  1980's   Prolapsa    Current Outpatient Prescriptions  Medication Sig Dispense Refill  . atenolol (TENORMIN) 50 MG tablet Take 50 mg by mouth daily.    . benazepril-hydrochlorthiazide (LOTENSIN HCT) 10-12.5 MG per tablet Take 1 tablet by mouth daily.    Marland Kitchen CALCIUM PO Take  800 mg by mouth daily.     . Cholecalciferol (VITAMIN D PO) Take 2,000 Units by mouth daily.    Marland Kitchen estradiol (ESTRACE) 0.1 MG/GM vaginal cream Use 1/2 g vaginally every night for the first 2 weeks, then use 1/2 g vaginally two or three times per week 42.5 g 2  . estradiol (ESTRACE) 0.5 MG tablet TAKE 1 TABLET ONCE DAILY. 30 tablet 10   No current facility-administered medications for this visit.      ALLERGIES: Thimerosal  Family History  Problem Relation Age of Onset  . Thyroid disease Sister   . Hypertension Maternal Aunt   . Stroke Mother   . Breast cancer Other 80    Social History   Social History  . Marital status: Married    Spouse name: N/A  . Number of children: N/A  . Years of education: N/A   Occupational History  . Not on file.   Social History Main Topics  . Smoking status: Former Smoker    Types: Cigars    Quit date: 05/09/1973  . Smokeless tobacco: Never Used  . Alcohol use 4.2 oz/week    7 Standard drinks or equivalent per week     Comment: a glass of  wine at night, occ beer  . Drug use: No  . Sexual activity: Yes    Partners: Male    Birth control/ protection: Post-menopausal, Surgical     Comment: TVH   Other Topics Concern  . Not on file   Social History Narrative  . No narrative on file    ROS:  Pertinent items are noted in HPI.  PHYSICAL EXAMINATION:    BP 118/62 (BP Location: Right Arm, Patient Position: Sitting, Cuff Size: Normal)   Pulse 60   Resp 16   Wt 110 lb (49.9 kg)   LMP 12/30/1983   BMI 19.96 kg/m     General appearance: alert, cooperative and appears stated age    Pelvic: External genitalia:  no lesions              Urethra:  normal appearing urethra with no masses, tenderness or lesions              Bartholins and Skenes: normal                 Vagina: normal appearing vagina with normal color and discharge, no lesions              Cervix:  Absent.                 Bimanual Exam:  Uterus:   Absent.               Adnexa: no mass, fullness, tenderness         Valsalva with her #3 ring with support pessary in place, has third degree cystocele. Valsalva with sample #4 ring with support, has third degree cystocele.  Valsalva with sample #5 ring with support, has urinary incontinence. Valsalva with sample #5 incontinence dish, no significant cystocele but can feel it.  No stress incontinence. Valsalva with sample #4 incontinence dish, no significant cystocele, and is comfortable.  No stress incontinence.   Chaperone was present for exam.  ASSESSMENT  Pelvic organ prolapse. Pessary patient.  Status post total vaginal hysterectomy.  ERT.    PLAN  Pessary cleansed and given to patient in a biobag.  We will order a #3 incontinence dish for the patient and have her return for this.  At her annual exam in June, we can do her pessary check.    An After Visit Summary was printed and given to the patient.  __15____ minutes face to face time of which over 50% was spent in counseling.

## 2017-05-15 ENCOUNTER — Encounter: Payer: Self-pay | Admitting: Obstetrics and Gynecology

## 2017-05-15 ENCOUNTER — Telehealth: Payer: Self-pay | Admitting: Obstetrics and Gynecology

## 2017-05-15 ENCOUNTER — Ambulatory Visit (INDEPENDENT_AMBULATORY_CARE_PROVIDER_SITE_OTHER): Payer: Medicare Other | Admitting: Obstetrics and Gynecology

## 2017-05-15 VITALS — BP 118/64 | HR 50 | Ht 62.25 in | Wt 108.0 lb

## 2017-05-15 DIAGNOSIS — N8110 Cystocele, unspecified: Secondary | ICD-10-CM

## 2017-05-15 NOTE — Telephone Encounter (Signed)
Return in about 10 days (around 05/25/2017) for pessary check - Dr. Quincy Simmonds

## 2017-05-15 NOTE — Progress Notes (Signed)
GYNECOLOGY  VISIT   HPI: 71 y.o.   Married  Caucasian  female   G3P0011 with Patient's last menstrual period was 12/30/1983.   here for pessary insertion.    We fitted her for a #3 incontinence dish.   GYNECOLOGIC HISTORY: Patient's last menstrual period was 12/30/1983. Contraception: Hysterectomy Menopausal hormone therapy:  Estrace vaginal cream Last mammogram: 06-12-16 3D Density B/Neg/BiRads1:[facility] Last pap smear:Unsure        OB History    Gravida Para Term Preterm AB Living   '3 2     1 1   ' SAB TAB Ectopic Multiple Live Births     1               There are no active problems to display for this patient.   Past Medical History:  Diagnosis Date  . Cystocele 06/15/00  . Hypertension   . MVP (mitral valve prolapse)   . Thyroid nodule 05/06/16   right and left thyroid nodule     Past Surgical History:  Procedure Laterality Date  . back fusion  2004, 1990   lumbar  . BREAST BIOPSY Left   . TUBAL LIGATION     BTSP  . VAGINAL HYSTERECTOMY  1980's   Prolapsa    Current Outpatient Prescriptions  Medication Sig Dispense Refill  . atenolol (TENORMIN) 50 MG tablet Take 50 mg by mouth daily.    . benazepril-hydrochlorthiazide (LOTENSIN HCT) 10-12.5 MG per tablet Take 1 tablet by mouth daily.    Marland Kitchen CALCIUM PO Take 800 mg by mouth daily.     . Cholecalciferol (VITAMIN D PO) Take 2,000 Units by mouth daily.    . cyclobenzaprine (FLEXERIL) 5 MG tablet Take 1 tablet by mouth at bedtime as needed.    Marland Kitchen estradiol (ESTRACE) 0.1 MG/GM vaginal cream Use 1/2 g vaginally every night for the first 2 weeks, then use 1/2 g vaginally two or three times per week 42.5 g 2  . estradiol (ESTRACE) 0.5 MG tablet TAKE 1 TABLET ONCE DAILY. 30 tablet 10   No current facility-administered medications for this visit.      ALLERGIES: Thimerosal  Family History  Problem Relation Age of Onset  . Thyroid disease Sister   . Stroke Mother   . Breast cancer Other 44  . Hypertension Maternal Aunt      Social History   Social History  . Marital status: Married    Spouse name: N/A  . Number of children: N/A  . Years of education: N/A   Occupational History  . Not on file.   Social History Main Topics  . Smoking status: Former Smoker    Types: Cigars    Quit date: 05/09/1973  . Smokeless tobacco: Never Used  . Alcohol use 4.2 oz/week    7 Standard drinks or equivalent per week     Comment: a glass of wine at night, occ beer  . Drug use: No  . Sexual activity: Yes    Partners: Male    Birth control/ protection: Post-menopausal, Surgical     Comment: TVH   Other Topics Concern  . Not on file   Social History Narrative  . No narrative on file    ROS:  Pertinent items are noted in HPI.  PHYSICAL EXAMINATION:    BP 118/64 (BP Location: Right Arm, Patient Position: Sitting, Cuff Size: Normal)   Pulse (!) 50   Ht 5' 2.25" (1.581 m)   Wt 108 lb (49 kg)   LMP  12/30/1983   BMI 19.60 kg/m     General appearance: alert, cooperative and appears stated age   Patient's ring #3 with support pessary removed, cleansed, and given to patient in a biobag.  Premier #3 incontinence dish fitted.  Lot number [id number].  Patient is able to place and remove.  She had some urinary incontinence with this.  We did retry the #4 incontinence dish and she could feel the pressure of this.  We did notice the difference in the size of the fitting kit and the actual product the patient receives.  The Premier pessary is actually smaller than the fitting kit sizes.   Chaperone was present for exam.  ASSESSMENT  Cystocele.  Stress incontinence.   PLAN  She will wear the incontinence dish #3.  Continue vaginal estrogen cream. Return in 10 days for a recheck.  Use a pad for protection.  If this is not satisfactory, she has the option to try a Premier #4 dish with support or consider surgical care also.   An After Visit Summary was printed and given to the patient.  _15_____ minutes  face to face time of which over 50% was spent in counseling.

## 2017-05-28 ENCOUNTER — Encounter: Payer: Self-pay | Admitting: Obstetrics and Gynecology

## 2017-05-28 ENCOUNTER — Ambulatory Visit (INDEPENDENT_AMBULATORY_CARE_PROVIDER_SITE_OTHER): Payer: Medicare Other | Admitting: Obstetrics and Gynecology

## 2017-05-28 VITALS — BP 110/60 | HR 64 | Resp 16 | Ht 62.25 in | Wt 108.0 lb

## 2017-05-28 DIAGNOSIS — N8110 Cystocele, unspecified: Secondary | ICD-10-CM

## 2017-05-28 MED ORDER — ESTRADIOL 0.1 MG/GM VA CREA: TOPICAL_CREAM | VAGINAL | 0 refills | Status: DC

## 2017-05-28 NOTE — Progress Notes (Signed)
GYNECOLOGY  VISIT   HPI: 71 y.o.   Married  Caucasian  female   G3P0011 with Patient's last menstrual period was 12/30/1983.   here for  Recheck pessary.  Doing better with the incontinence dish than with the former ring with support. Pessary can come out with a BM. No urinary leakage.  No vaginal drainage or bleeding.   Pessary is comfortable.  Needs refill of the Estrace cream.   GYNECOLOGIC HISTORY: Patient's last menstrual period was 12/30/1983. Contraception:  Hysterectomy Menopausal hormone therapy:  Estrace vaginal cream/tablet Last mammogram:   06-12-16 3D Density B/Neg/BiRads1:[facility] Last pap smear:   unsure        OB History    Gravida Para Term Preterm AB Living   3 2     1 1    SAB TAB Ectopic Multiple Live Births     1               There are no active problems to display for this patient.   Past Medical History:  Diagnosis Date  . Cystocele 06/15/00  . Hypertension   . MVP (mitral valve prolapse)   . Thyroid nodule 05/06/16   right and left thyroid nodule     Past Surgical History:  Procedure Laterality Date  . back fusion  2004, 1990   lumbar  . BREAST BIOPSY Left   . TUBAL LIGATION     BTSP  . VAGINAL HYSTERECTOMY  1980's   Prolapsa    Current Outpatient Prescriptions  Medication Sig Dispense Refill  . atenolol (TENORMIN) 50 MG tablet Take 50 mg by mouth daily.    . benazepril-hydrochlorthiazide (LOTENSIN HCT) 10-12.5 MG per tablet Take 1 tablet by mouth daily.    Marland Kitchen CALCIUM PO Take 800 mg by mouth daily.     . Cholecalciferol (VITAMIN D PO) Take 2,000 Units by mouth daily.    . cyclobenzaprine (FLEXERIL) 5 MG tablet Take 1 tablet by mouth at bedtime as needed.    Marland Kitchen estradiol (ESTRACE) 0.1 MG/GM vaginal cream Use 1/2 g vaginally every night for the first 2 weeks, then use 1/2 g vaginally two or three times per week 42.5 g 2  . estradiol (ESTRACE) 0.5 MG tablet TAKE 1 TABLET ONCE DAILY. 30 tablet 10   No current facility-administered  medications for this visit.      ALLERGIES: Thimerosal  Family History  Problem Relation Age of Onset  . Thyroid disease Sister   . Stroke Mother   . Breast cancer Other 44  . Hypertension Maternal Aunt     Social History   Social History  . Marital status: Married    Spouse name: N/A  . Number of children: N/A  . Years of education: N/A   Occupational History  . Not on file.   Social History Main Topics  . Smoking status: Former Smoker    Types: Cigars    Quit date: 05/09/1973  . Smokeless tobacco: Never Used  . Alcohol use 4.2 oz/week    7 Standard drinks or equivalent per week     Comment: a glass of wine at night, occ beer  . Drug use: No  . Sexual activity: Yes    Partners: Male    Birth control/ protection: Post-menopausal, Surgical     Comment: TVH   Other Topics Concern  . Not on file   Social History Narrative  . No narrative on file    ROS:  Pertinent items are noted in HPI.  PHYSICAL  EXAMINATION:    BP 110/60 (BP Location: Right Arm, Patient Position: Sitting, Cuff Size: Normal)   Pulse 64   Resp 16   Ht 5' 2.25" (1.581 m)   Wt 108 lb (49 kg)   LMP 12/30/1983   BMI 19.60 kg/m     General appearance: alert, cooperative and appears stated age    Pelvic: External genitalia:  no lesions              Urethra:  normal appearing urethra with no masses, tenderness or lesions              Bartholins and Skenes: normal                 Vagina: normal appearing vagina with normal color and discharge, no lesions.. Second degree cystocele and second degree rectocele.              Cervix: no lesions                Bimanual Exam:  Uterus:  normal size, contour, position, consistency, mobility, non-tender              Adnexa: no mass, fullness, tenderness        With Valsalva, she does have some bladder and rectum prolapse around the pessary. Pessary removed, cleansed, and replaced.   Chaperone was present for exam.  ASSESSMENT  Cystocele.   Rectocele.  Doing well w/ incontinence dish.  PLAN  Continue pessary use.  Refill of vaginal Estrace cream.  1/2 gm pv 2 - 3 times per week.  Can also use water based lubricants for vaginal hydration.  Return for annual exam in July.  I encouraged her to increase her activity further to test the limits of the pessary.  If it falls out, then we can order the #4 incontinence dish.    An After Visit Summary was printed and given to the patient.  __15____ minutes face to face time of which over 50% was spent in counseling.

## 2017-06-10 ENCOUNTER — Ambulatory Visit: Payer: Medicare Other | Admitting: Obstetrics and Gynecology

## 2017-06-29 ENCOUNTER — Ambulatory Visit: Payer: Medicare Other | Admitting: Obstetrics and Gynecology

## 2017-06-29 NOTE — Progress Notes (Deleted)
71 y.o. G48P0011 Married Caucasian female here for annual exam.    PCP:     Patient's last menstrual period was 12/30/1983.           Sexually active: {yes no:314532}  The current method of family planning is status post hysterectomy.    Exercising: {yes no:314532}  {types:19826} Smoker:  {YES NO:22349}  Health Maintenance: Pap:  Unsure History of abnormal Pap:  no MMG:  06/12/16 BIRADS 1 negative: [facility] Colonoscopy:  *** BMD:   06/12/16  Result:  Osteoporosis -- [facility] TDaP:  2014 Gardasil:   n/a HIV: Hep C: Screening Labs:  Hb today: ***, Urine today: ***   reports that she quit smoking about 44 years ago. Her smoking use included Cigars. She has never used smokeless tobacco. She reports that she drinks about 4.2 oz of alcohol per week . She reports that she does not use drugs.  Past Medical History:  Diagnosis Date  . Cystocele 06/15/00  . Hypertension   . MVP (mitral valve prolapse)   . Thyroid nodule 05/06/16   right and left thyroid nodule     Past Surgical History:  Procedure Laterality Date  . back fusion  2004, 1990   lumbar  . BREAST BIOPSY Left   . TUBAL LIGATION     BTSP  . VAGINAL HYSTERECTOMY  1980's   Prolapsa    Current Outpatient Prescriptions  Medication Sig Dispense Refill  . atenolol (TENORMIN) 50 MG tablet Take 50 mg by mouth daily.    . benazepril-hydrochlorthiazide (LOTENSIN HCT) 10-12.5 MG per tablet Take 1 tablet by mouth daily.    Marland Kitchen CALCIUM PO Take 800 mg by mouth daily.     . Cholecalciferol (VITAMIN D PO) Take 2,000 Units by mouth daily.    . cyclobenzaprine (FLEXERIL) 5 MG tablet Take 1 tablet by mouth at bedtime as needed.    Marland Kitchen estradiol (ESTRACE) 0.1 MG/GM vaginal cream Use 1/2 g vaginally two or three times per week 42.5 g 0  . estradiol (ESTRACE) 0.5 MG tablet TAKE 1 TABLET ONCE DAILY. 30 tablet 10   No current facility-administered medications for this visit.     Family History  Problem Relation Age of Onset  . Thyroid disease  Sister   . Stroke Mother   . Breast cancer Other 44  . Hypertension Maternal Aunt     ROS:  Pertinent items are noted in HPI.  Otherwise, a comprehensive ROS was negative.  Exam:   LMP 12/30/1983     General appearance: alert, cooperative and appears stated age Head: Normocephalic, without obvious abnormality, atraumatic Neck: no adenopathy, supple, symmetrical, trachea midline and thyroid normal to inspection and palpation Lungs: clear to auscultation bilaterally Breasts: normal appearance, no masses or tenderness, No nipple retraction or dimpling, No nipple discharge or bleeding, No axillary or supraclavicular adenopathy Heart: regular rate and rhythm Abdomen: soft, non-tender; no masses, no organomegaly Extremities: extremities normal, atraumatic, no cyanosis or edema Skin: Skin color, texture, turgor normal. No rashes or lesions Lymph nodes: Cervical, supraclavicular, and axillary nodes normal. No abnormal inguinal nodes palpated Neurologic: Grossly normal  Pelvic: External genitalia:  no lesions              Urethra:  normal appearing urethra with no masses, tenderness or lesions              Bartholins and Skenes: normal                 Vagina: normal appearing vagina  with normal color and discharge, no lesions              Cervix: no lesions              Pap taken: {yes no:314532} Bimanual Exam:  Uterus:  normal size, contour, position, consistency, mobility, non-tender              Adnexa: no mass, fullness, tenderness              Rectal exam: {yes no:314532}.  Confirms.              Anus:  normal sphincter tone, no lesions  Chaperone was present for exam.  Assessment:   Well woman visit with normal exam.   Plan: Mammogram screening discussed. Recommended self breast awareness. Pap and HR HPV as above. Guidelines for Calcium, Vitamin D, regular exercise program including cardiovascular and weight bearing exercise.   Follow up annually and prn.   Additional  counseling given.  {yes [id number]. _______ minutes face to face time of which over 50% was spent in counseling.    After visit summary provided.

## 2017-07-08 ENCOUNTER — Ambulatory Visit: Payer: Medicare Other | Admitting: Family Medicine

## 2017-07-15 ENCOUNTER — Ambulatory Visit (INDEPENDENT_AMBULATORY_CARE_PROVIDER_SITE_OTHER): Payer: Medicare Other | Admitting: Family Medicine

## 2017-07-15 DIAGNOSIS — M7582 Other shoulder lesions, left shoulder: Secondary | ICD-10-CM | POA: Insufficient documentation

## 2017-07-15 NOTE — Assessment & Plan Note (Signed)
Patient is here with signs and symptoms consistent with left-sided rotator cuff tendinitis. History does not include any falls or trauma making the possibility of a acute rotator cuff tear unlikely. Patient's age makes chronic tendinopathy a possibility with acute tendinitis. - Rotator cuff strengthening exercises provided today. - OTC naproxen 440 mg twice a day 5 days then 220-440mg  twice a day when necessary after that - Discussed the possibility of subacromial injection today but patient preferred more conservative therapy at this time. - Continue RICE therapy - Follow-up in 4-6 weeks for reevaluation  Next: If symptoms persist or worsen consider diagnostic ultrasound and/or subacromial injection.

## 2017-07-15 NOTE — Progress Notes (Signed)
   HPI  CC: Left shoulder pain Possible injury 4 months ago, but nothing traumatic. Pain w/ changing clothes. Pain w/ driving. Can't sleep on that side anymore.  Patient has had neck/nerve issues in the past, pain is much different than that pain.  Traumatic: possible; reaching behind couch  Location: Lt, shoulder, posterior aspect; some vague posterior elbow/distal arm pain  Quality: mostly aching, but occasional sharp  Duration: 4 months  Timing: intermittent but daily  Improving/Worsening: staying the same  Makes better: ibuprofen. rest  Makes worse: using the arm, sleeping on that side  Associated symptoms: none   Previous Interventions Tried: ibuprofen, heat >> some help, but nothing longterm   Past Injuries: none to either arm/shoulder  Past Surgeries: Lumbar fixation x2 (most recent ~2004)  Smoking: no  Family Hx: unremarkable.   ROS: Per HPI; in addition no fever, no rash, no additional weakness, no additional numbness, no additional paresthesias, and no additional falls/injury.   Objective: BP 114/80   Ht 5\' 3"  (1.6 m)   Wt 107 lb (48.5 kg)   LMP 12/30/1983   BMI 18.95 kg/m  Gen: Rt-Hand Dominant. NAD, well groomed, a/o x3, normal affect.  CV: Well-perfused. Warm.  Resp: Non-labored.  Neuro: Sensation intact throughout. No gross coordination deficits.  Gait: Nonpathologic posture, unremarkable stride without signs of limp or balance issues. Shoulder, Left: No evidence of bony deformity, asymmetry, or muscle atrophy; TTP along the posterior aspect of the shoulder near the insertion of the infraspinatus, and some mild tenderness along the superior pole of the tricep body. No tenderness over long head of biceps (bicipital groove). No TTP at [facility] joint. Full active and passive range of motion (180 flex Huel Cote /150Abd /90ER /70IR), Thumb to T12 without significant tenderness. Strength 5/5 throughout. No abnormal scapular function observed. Sensation intact. Peripheral  pulses intact.  Special Tests:   - Crossarm test: NEG for AC pain. POS for posterior capsule tenderness   - Empty can: Positive   - Hawkins: NEG   - Neer test: NEG   - Obrien's test: Positive along the posterior aspect of the shoulder   - Yergason's: NEG   - Speeds test: NEG   Assessment and plan:  Rotator cuff tendinitis, left Patient is here with signs and symptoms consistent with left-sided rotator cuff tendinitis. History does not include any falls or trauma making the possibility of a acute rotator cuff tear unlikely. Patient's age makes chronic tendinopathy a possibility with acute tendinitis. - Rotator cuff strengthening exercises provided today. - OTC naproxen 440 mg twice a day 5 days then 220-440mg  twice a day when necessary after that - Discussed the possibility of subacromial injection today but patient preferred more conservative therapy at this time. - Continue RICE therapy - Follow-up in 4-6 weeks for reevaluation  Next: If symptoms persist or worsen consider diagnostic ultrasound and/or subacromial injection.   Elberta Leatherwood, MD,MS [facility] 07/15/2017 5:28 PM

## 2017-07-15 NOTE — Patient Instructions (Signed)
It was a pleasure seeing you today in our clinic. Today we discussed your shoulder pain. This injury and the pain involved may take a long time to heal/resolve, so be patient. Here is the treatment plan I would like for you to follow:  For most musculoskeletal pain we physicians tend to recommend the "R.I.C.E." method of therapy. This stands for "Rest", "Ice", "Compression", and "Elevation". - Rest: I would like for you to take it easy for a period of time. This may be a period of days or weeks, everyone is different. Allow pain to be your guide. Achiness and soreness is to be expected, but true pain is to be avoided. - Ice: Using an ice pack 2-4 times a day for ~20 minutes each time can help with pain, swelling, and healing. - Compression: Using a compression sleeve or ACE Bandage (both available at most pharmacies) can help with swelling and pain. Having these on through the day is ideal, but do not sleep with these in place. - Elevation: It is not always possible to elevate an injury due to it's location on your body. However, whenever possible it would be beneficial to elevate musculoskeletal injuries above the level of the heart. (Example: propping an injured ankle up on pillows at night while asleep)  Aleve (naproxen):  Begin taking the Aleve/naproxen 2 times a day (scheduled) for 5 days, then take this up to 2 times a day AS NEEDED.  Dosing: - 440mg  of Aleve/naproxen 2 times a day as needed.  I provided you a handout with rotator cuff strengthening exercises. Perform these once a day every day. - I would like to see you back in 4 weeks.

## 2017-07-27 ENCOUNTER — Encounter: Payer: Self-pay | Admitting: Obstetrics and Gynecology

## 2017-07-27 ENCOUNTER — Ambulatory Visit (INDEPENDENT_AMBULATORY_CARE_PROVIDER_SITE_OTHER): Payer: Medicare Other | Admitting: Obstetrics and Gynecology

## 2017-07-27 VITALS — BP 116/60 | HR 68 | Resp 16 | Ht 62.5 in | Wt 108.0 lb

## 2017-07-27 DIAGNOSIS — Z124 Encounter for screening for malignant neoplasm of cervix: Secondary | ICD-10-CM

## 2017-07-27 DIAGNOSIS — Z01419 Encounter for gynecological examination (general) (routine) without abnormal findings: Secondary | ICD-10-CM

## 2017-07-27 MED ORDER — ESTRADIOL 0.5 MG PO TABS: 0.5000 mg | ORAL_TABLET | Freq: Every day | ORAL | 0 refills | Status: DC

## 2017-07-27 NOTE — Progress Notes (Signed)
71 y.o. G41P0011 Married Caucasian female here for annual exam.    On oral Estrace since age 59 or 71 yo. States she did not really have symptoms at the time. Using Estrace vaginal cream twice a week.  Has hx of osteoporosis.  Has declines tx.  She did tx with Fosamax for a short time.  She had reflux symptoms.   Using incontinence dish and Estrace vaginal cream.   Two nieces with breast cancer in their 33s.  They are her brother's children.   Traveling to [place] in mid September.   PCP:  Dr. Kathryne Eriksson   Patient's last menstrual period was 12/30/1983.           Sexually active: Yes.    The current method of family planning is status post hysterectomy.    Exercising: Yes.    walking Smoker:  no  Health Maintenance: Pap:  Years ago per patient  History of abnormal Pap:  no MMG:  06/23/17 BIRADS 1 negative/density b Colonoscopy:  2008 normal with Dr. Lenise Herald due 2018 BMD:   06/12/16  Result  Stable Osteoporosis: [facility] TDaP:  2014 Screening Labs: PCP takes care of all labs   reports that she quit smoking about 44 years ago. Her smoking use included Cigars. She has never used smokeless tobacco. She reports that she drinks about 4.2 oz of alcohol per week . She reports that she does not use drugs.  Past Medical History:  Diagnosis Date  . Cystocele 06/15/00  . Hypertension   . MVP (mitral valve prolapse)   . Osteoporosis    bilateral hips  . Thyroid nodule 05/06/16   right and left thyroid nodule     Past Surgical History:  Procedure Laterality Date  . back fusion  2004, 1990   lumbar  . BREAST BIOPSY Left   . TUBAL LIGATION     BTSP  . VAGINAL HYSTERECTOMY  1980's   Prolapsa    Current Outpatient Prescriptions  Medication Sig Dispense Refill  . atenolol (TENORMIN) 50 MG tablet Take 50 mg by mouth daily.    . benazepril-hydrochlorthiazide (LOTENSIN HCT) 10-12.5 MG per tablet Take 1 tablet by mouth daily.    Marland Kitchen CALCIUM PO Take 800 mg by mouth daily.     .  Cholecalciferol (VITAMIN D PO) Take 2,000 Units by mouth daily.    Marland Kitchen estradiol (ESTRACE) 0.1 MG/GM vaginal cream Use 1/2 g vaginally two or three times per week 42.5 g 0  . estradiol (ESTRACE) 0.5 MG tablet TAKE 1 TABLET ONCE DAILY. 30 tablet 10   No current facility-administered medications for this visit.     Family History  Problem Relation Age of Onset  . Thyroid disease Sister   . Stroke Mother   . Breast cancer Other 44  . Hypertension Maternal Aunt     ROS:  Pertinent items are noted in HPI.  Otherwise, a comprehensive ROS was negative.  Exam:   BP 116/60 (BP Location: Right Arm, Patient Position: Sitting, Cuff Size: Normal)   Pulse 68   Resp 16   Ht 5' 2.5" (1.588 m)   Wt 108 lb (49 kg)   LMP 12/30/1983   BMI 19.44 kg/m     General appearance: alert, cooperative and appears stated age Head: Normocephalic, without obvious abnormality, atraumatic Neck: no adenopathy, supple, symmetrical, trachea midline and thyroid normal to inspection and palpation Lungs: clear to auscultation bilaterally Breasts: normal appearance, no masses or tenderness, No nipple retraction or dimpling, No nipple discharge  or bleeding, No axillary or supraclavicular adenopathy Heart: regular rate and rhythm Abdomen: soft, non-tender; no masses, no organomegaly Extremities: extremities normal, atraumatic, no cyanosis or edema Skin: Skin color, texture, turgor normal. No rashes or lesions Lymph nodes: Cervical, supraclavicular, and axillary nodes normal. No abnormal inguinal nodes palpated Neurologic: Grossly normal  Pelvic: External genitalia:  no lesions              Urethra:  normal appearing urethra with no masses, tenderness or lesions              Bartholins and Skenes: normal                 Vagina: normal appearing vagina with normal color and discharge, no lesions.              Cervix:  Absent.              Pap taken: No. Bimanual Exam:  Uterus:  Absent.              Adnexa: no mass,  fullness, tenderness              Rectal exam: Yes.  .  Confirms.              Anus:  normal sphincter tone, no lesions  Incontinence dish removed, cleansed, and replaced.   Chaperone was present for exam.  Assessment:   Well woman visit with normal exam. Status post hysterectomy.   Still has her ovaries.  On ERT.  Osteoporosis.  FH of breast cancer in nieces.  Plan: Mammogram screening discussed. Recommended self breast awareness. Pap and HR HPV as above. Guidelines for Calcium, Vitamin D, regular exercise program including cardiovascular and weight bearing exercise. Discussed WHI and risks of continued ERT.  She will start to wean off ERT by cutting the Estrace in half.  The goal is to have her stop prior to traveling to [place] in September. BMD next year.  She may get a trainer for exercise.  Colonoscopy due.  She will contact Dr. Lorie Apley office. Follow up annually and prn.   After visit summary provided.

## 2017-07-27 NOTE — Patient Instructions (Signed)

## 2017-08-12 ENCOUNTER — Ambulatory Visit (INDEPENDENT_AMBULATORY_CARE_PROVIDER_SITE_OTHER): Payer: Medicare Other | Admitting: Family Medicine

## 2017-08-12 ENCOUNTER — Encounter: Payer: Self-pay | Admitting: Family Medicine

## 2017-08-12 DIAGNOSIS — M7582 Other shoulder lesions, left shoulder: Secondary | ICD-10-CM | POA: Diagnosis present

## 2017-08-12 NOTE — Progress Notes (Signed)
   HPI  CC: F/u left shoulder pain. Patient is here with follow-up on her left-sided shoulder pain. She states that since her last visit her pain has significantly improved "90%". She endorses good compliance with the medication regimen of naproxen discussed at the last visit. She has also been doing her home exercises on a semi-regular basis. She endorses no worsening pain. She has been able to sleep without any discomfort. She overall feels very well. She denies any setbacks, swelling, erythema, weakness, numbness, trauma, or injury.  Medications/Interventions Tried: Naproxen, home exercise program  See HPI and/or previous note for associated ROS.  Objective: BP 110/70   Ht 5\' 3"  (1.6 m)   Wt 107 lb (48.5 kg)   LMP 12/30/1983   BMI 18.95 kg/m  Gen: Rt-Hand Dominant. NAD, well groomed, a/o x3, normal affect.  CV: Well-perfused. Warm.  Resp: Non-labored.  Neuro: Sensation intact throughout. No gross coordination deficits.  Gait: Nonpathologic posture, unremarkable stride without signs of limp or balance issues. Shoulder, Left: No evidence of bony deformity, asymmetry, or muscle atrophy; Mild TTP along the posterior aspect of the shoulder near the insertion of the infraspinatus. No longer w/ tenderness along the superior pole of the tricep body. No tenderness over long head of biceps (bicipital groove). No TTP at [facility] joint. Full active and passive range of motion. Strength 5/5. No abnormal scapular function observed. Sensation intact. Peripheral pulses intact.             Special Tests:                         - Crossarm test: NEG for AC pain. POS for posterior capsule tenderness (significantly improved)                         - Empty can: Positive (improved but less so than other previous tender areas)                         - Neer test: NEG  Assessment and plan:  Rotator cuff tendinitis, left Patient is here for follow-up on her rotator cuff tendinitis. Symptoms are significantly  improved since our visit last month. Patient endorsed good compliance with medication regimen and exercise. Patient was very pleased with her current outcome and stated that the current discomfort she is experiencing is very tolerable. She denies any setbacks at this time. - Encourage continued strengthening exercises at home. - Naproxen as needed - Rest, ice as needed. - Follow-up when necessary   Elberta Leatherwood, MD,MS [facility] Fellow 08/12/2017 8:44 PM

## 2017-08-12 NOTE — Assessment & Plan Note (Signed)
Patient is here for follow-up on her rotator cuff tendinitis. Symptoms are significantly improved since our visit last month. Patient endorsed good compliance with medication regimen and exercise. Patient was very pleased with her current outcome and stated that the current discomfort she is experiencing is very tolerable. She denies any setbacks at this time. - Encourage continued strengthening exercises at home. - Naproxen as needed - Rest, ice as needed. - Follow-up when necessary

## 2018-02-13 IMAGING — US US THYROID
1 series · 14 of 25 positions shown · non-contrast
Comparison: None.

CLINICAL DATA: Incidental nodule on screening examination

EXAM:
THYROID ULTRASOUND
TECHNIQUE: Ultrasound examination of the thyroid gland and adjacent soft
tissues was performed.

[Series 1: us thyroid · 0.05mm/px · 14 of 58 slices shown]
[im 1/58]
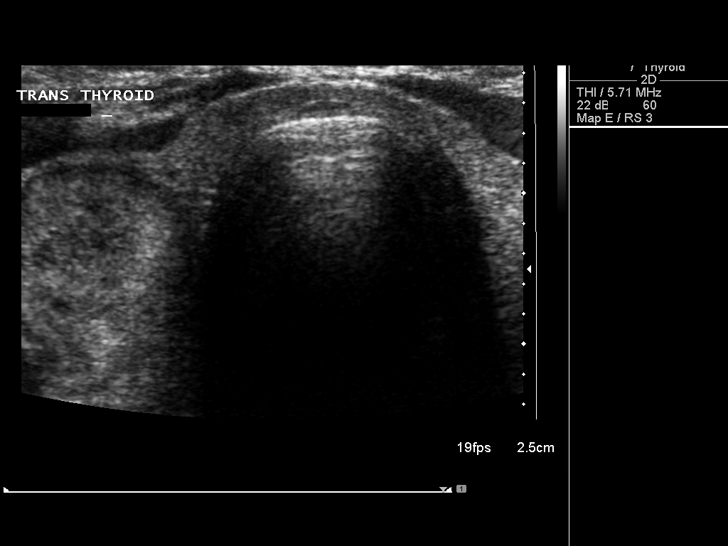
[im 5/58]
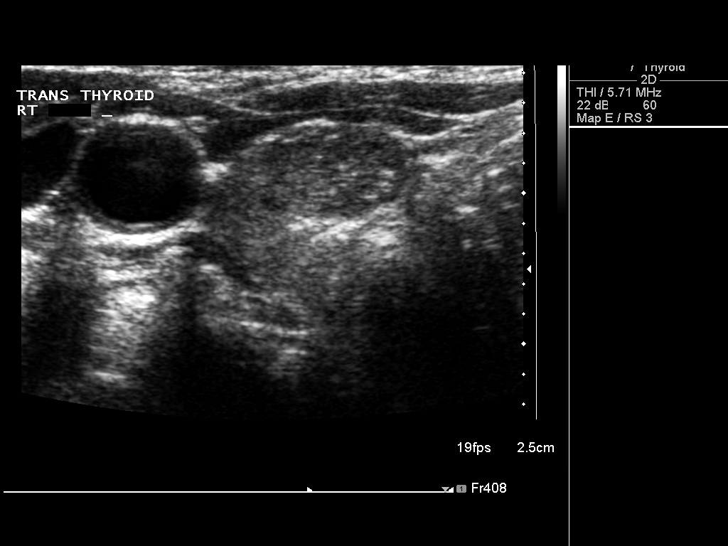
[im 10/58]
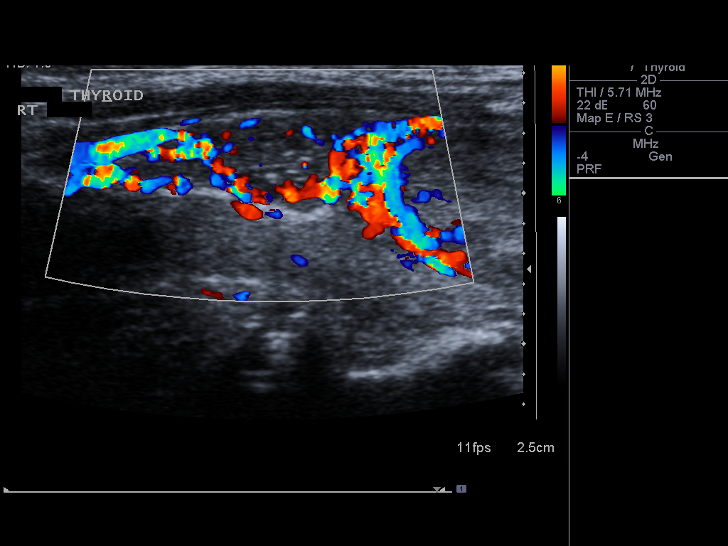
[im 15/58]
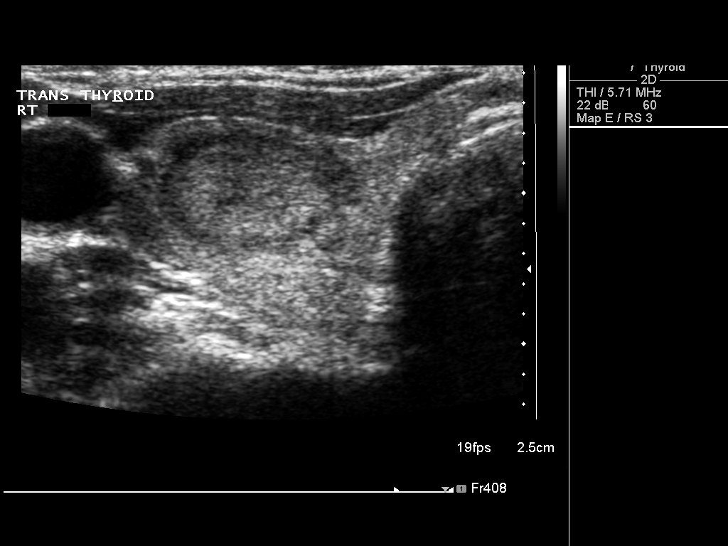
[im 20/58]
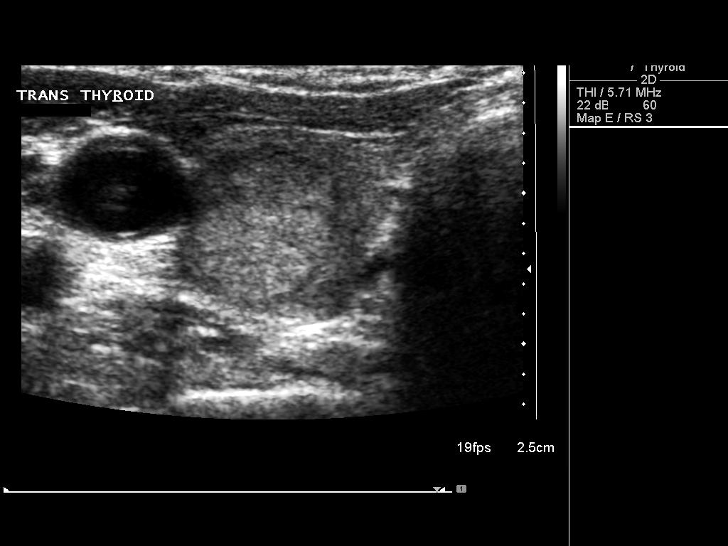
[im 22/58]
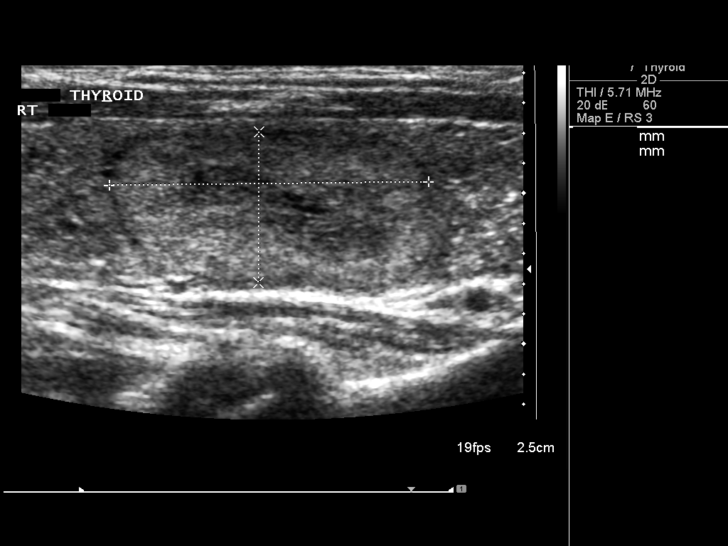
[im 27/58]
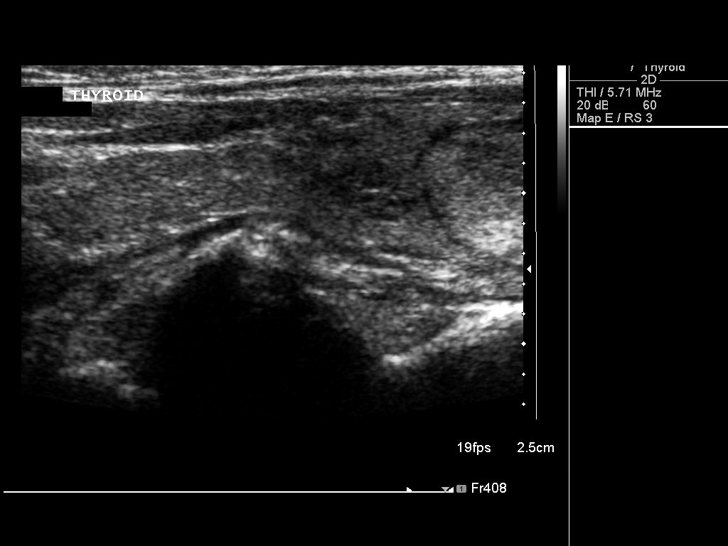
[im 31/58]
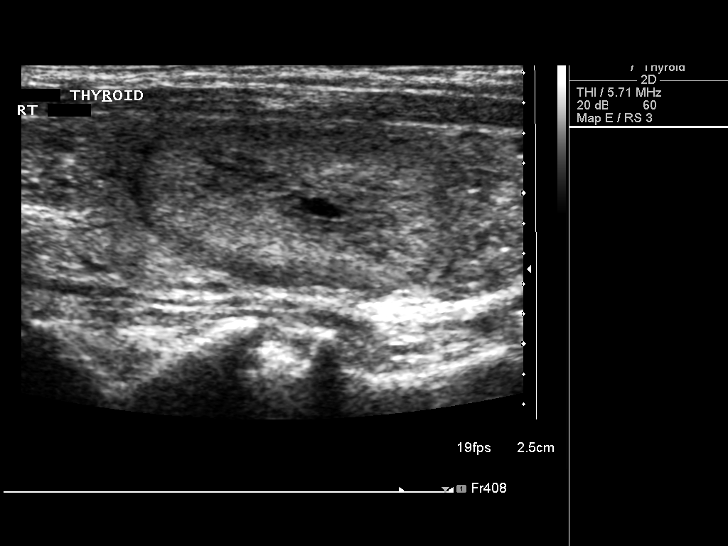
[im 36/58]
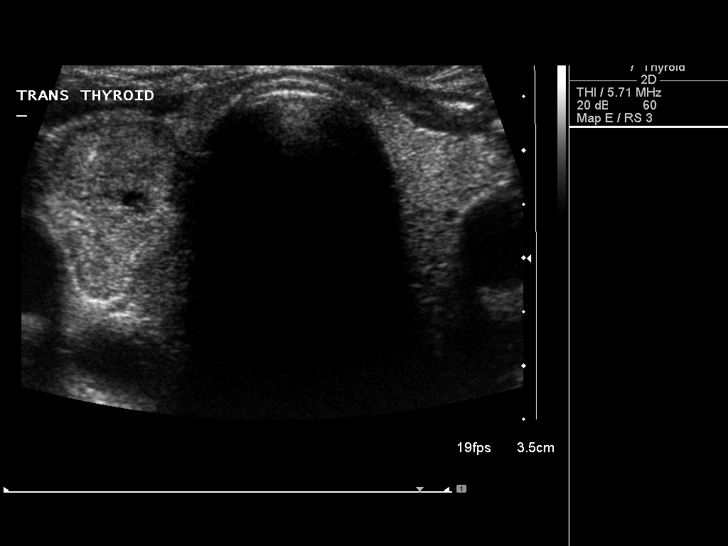
[im 39/58]
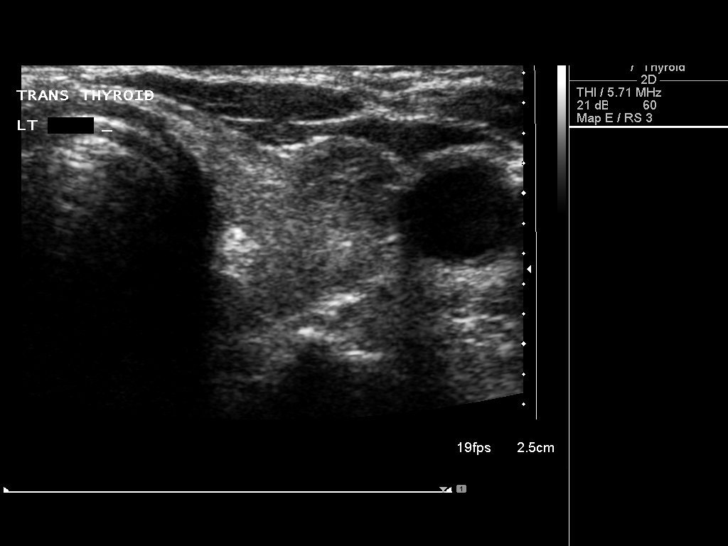
[im 43/58]
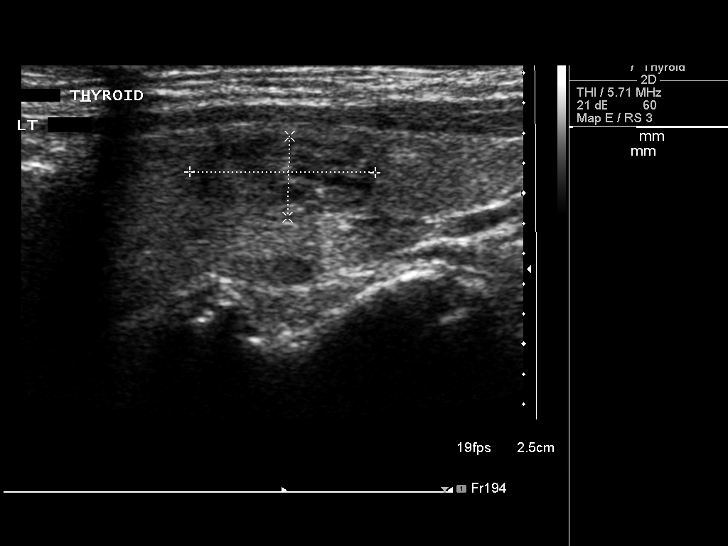
[im 48/58]
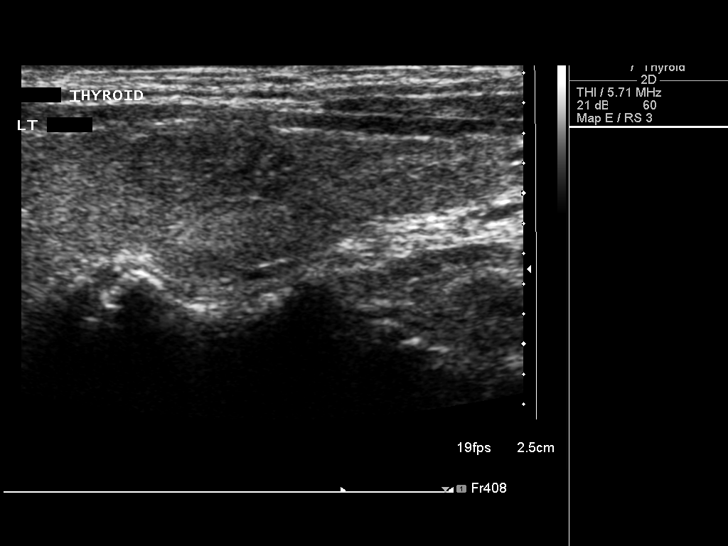
[im 53/58]
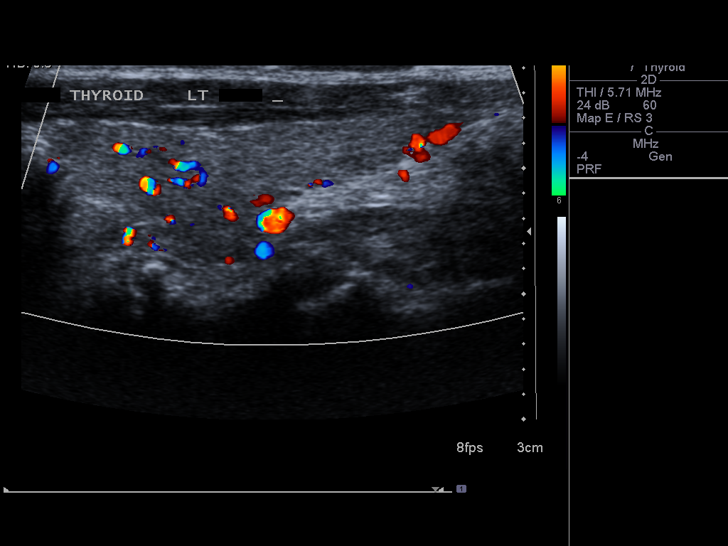
[im 58/58]
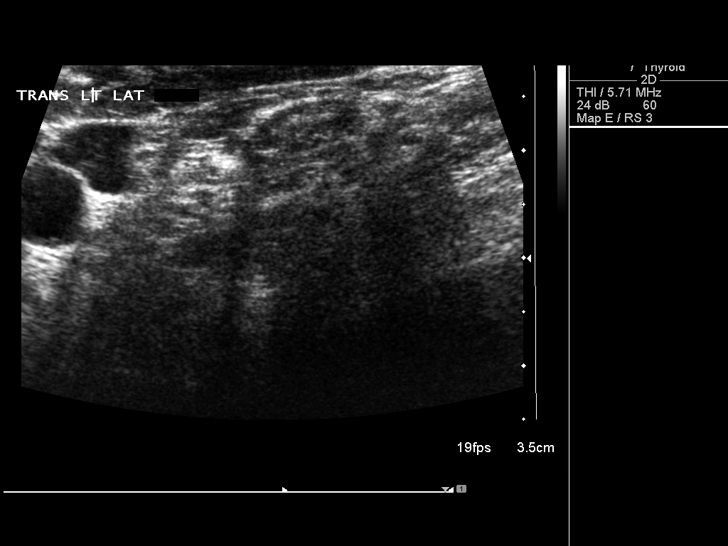

[14 of 25 positions shown; findings below may reference images not displayed]

FINDINGS: Right thyroid lobe

Measurements: 5.1 x 1.3 x 1.7 cm. Lower pole complex but
predominately solid nodule measures 2.1 x 1.0 x 1.5 cm. 1.0 cm upper
pole solid nodule. There is an adjacent 7 mm nodule.

Left thyroid lobe

Measurements: 3.4 x 1.1 x 1.2 cm. 1.2 x 0.5 x 0.8 cm solid left
upper pole nodule.

Isthmus

Thickness: 1 mm.  No nodules visualized.

Lymphadenopathy

None visualized.
IMPRESSION: Multiple nodules. There is a nodule in the right lower pole
measuring 2.1 cm. Findings meet consensus criteria for biopsy.
Ultrasound-guided fine needle aspiration should be considered, as
per the consensus statement: Management of Thyroid Nodules Detected
at US: Society of Radiologists in Ultrasound Consensus Conference

## 2018-03-14 IMAGING — US US THYROID BIOPSY
1 series · 13 of 14 positions shown · non-contrast
Comparison: US thyroid 05/06/2016

MEDICATIONS:
5 cc 1% lidocaine

COMPLICATIONS:
None immediate.

INDICATION: Indeterminate thyroid nodule

Right lower pole 2.1 cm x 1.0 cm x 1.5 cm
EXAM:
ULTRASOUND GUIDED FINE NEEDLE ASPIRATION OF INDETERMINATE THYROID
NODULE
TECHNIQUE: Informed written consent was obtained from the patient after a
discussion of the risks, benefits and alternatives to treatment.
Questions regarding the procedure were encouraged and answered. A
timeout was performed prior to the initiation of the procedure.

[Series 1: us thyroid biopsy · 0.06mm/px · 14 acquisitions, 13 frames shown]
[im 1/14]
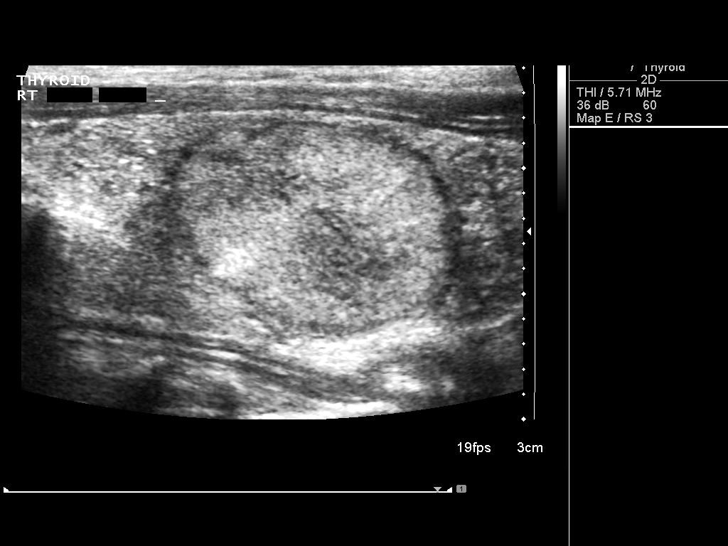
[im 2/14]
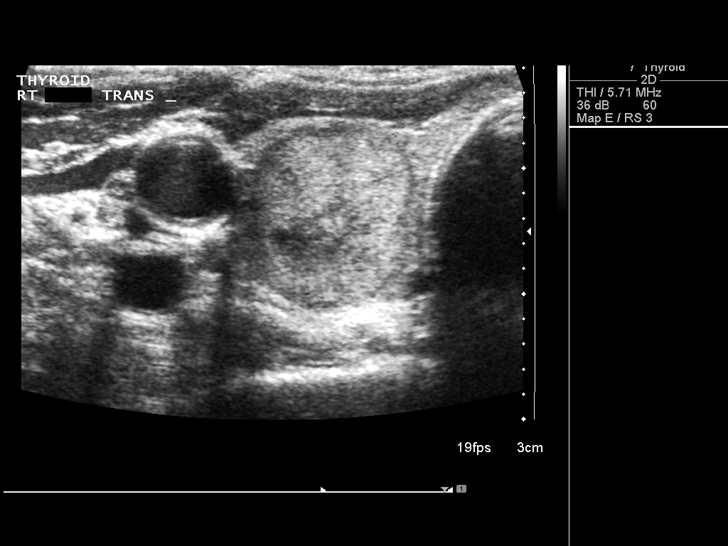
[im 3/14]
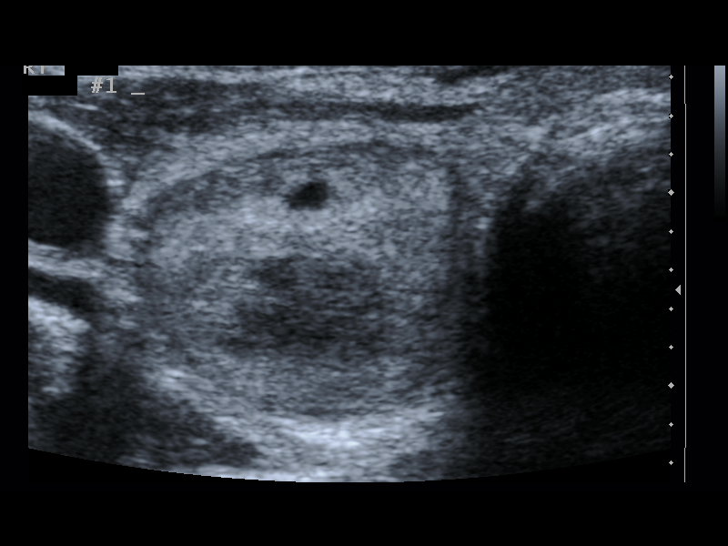
[im 4/14]
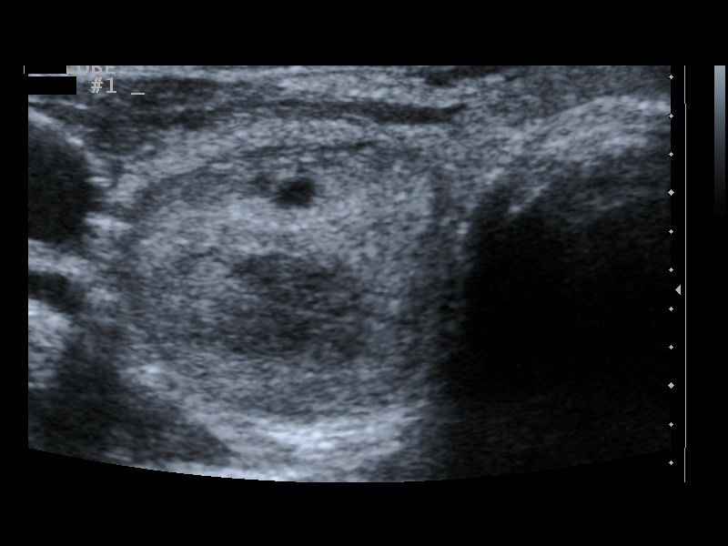
[im 5/14]
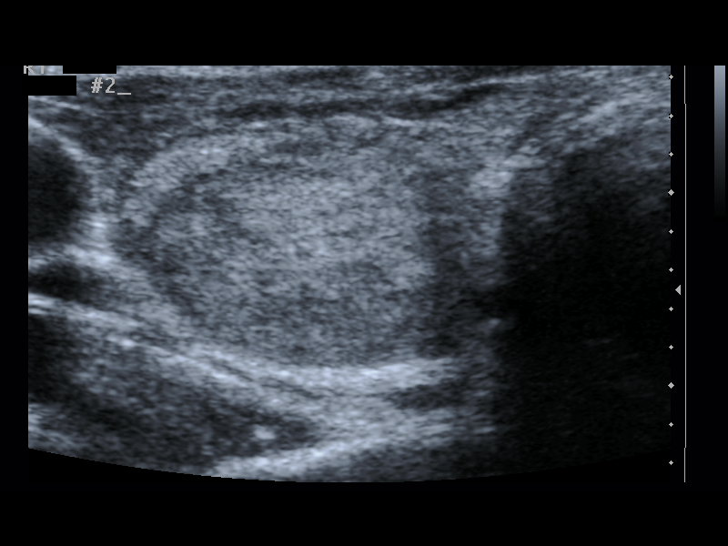
[im 6/14]
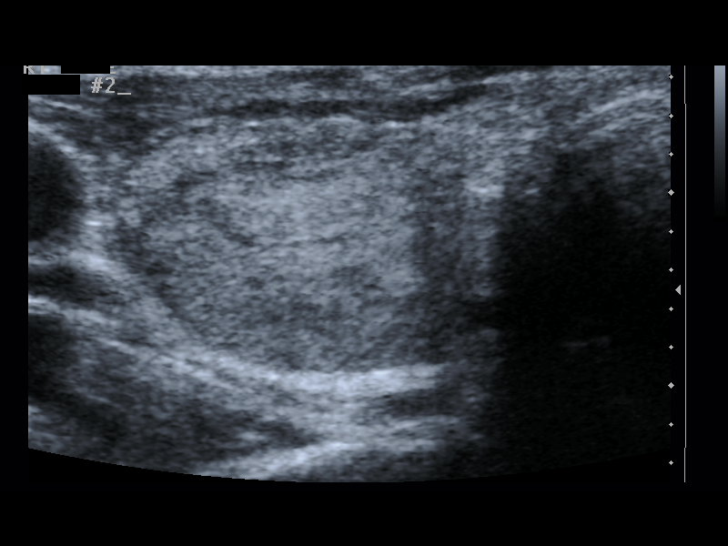
[im 8/14]
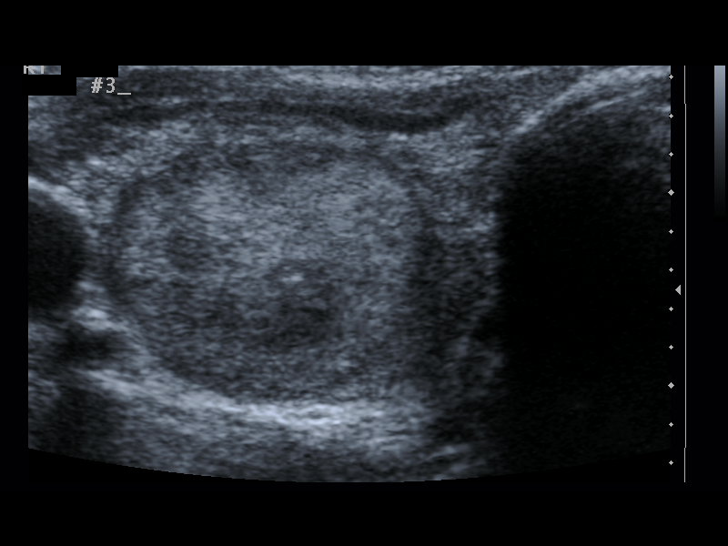
[im 9/14]
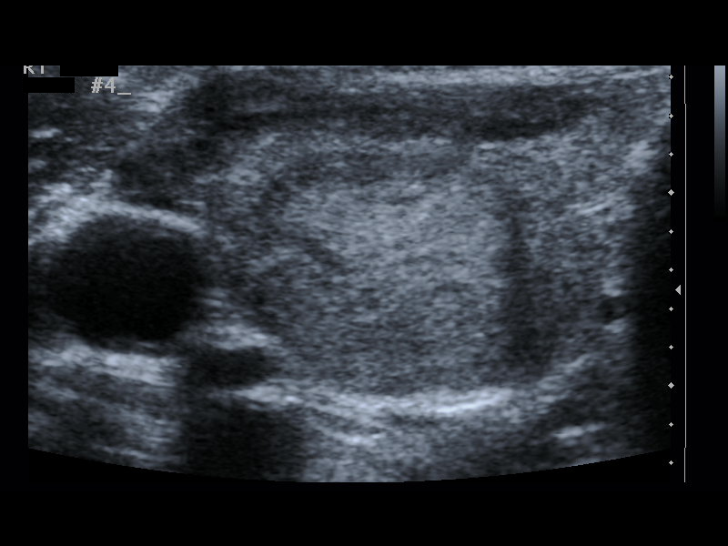
[im 10/14]
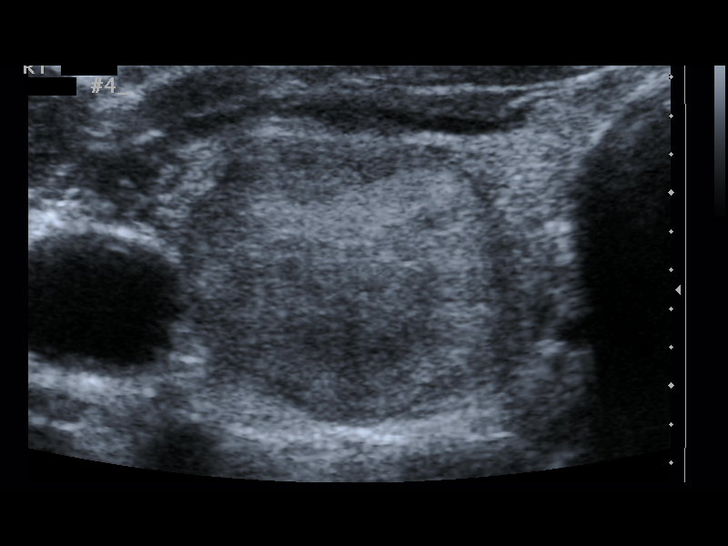
[im 11/14]
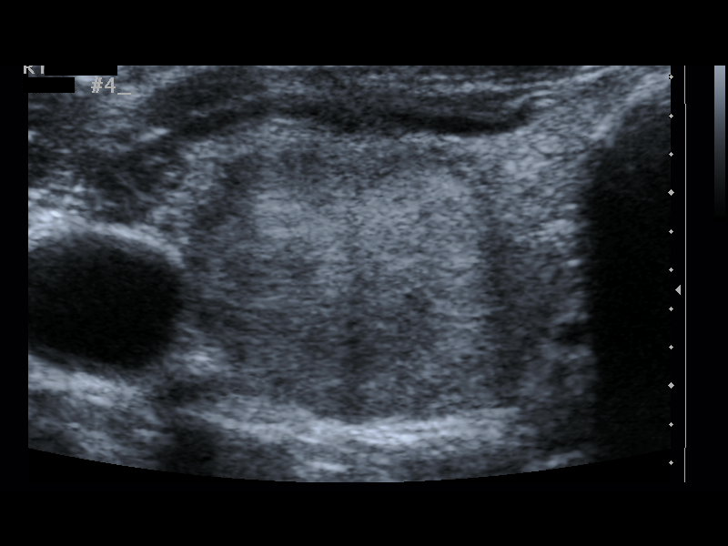
[im 12/14]
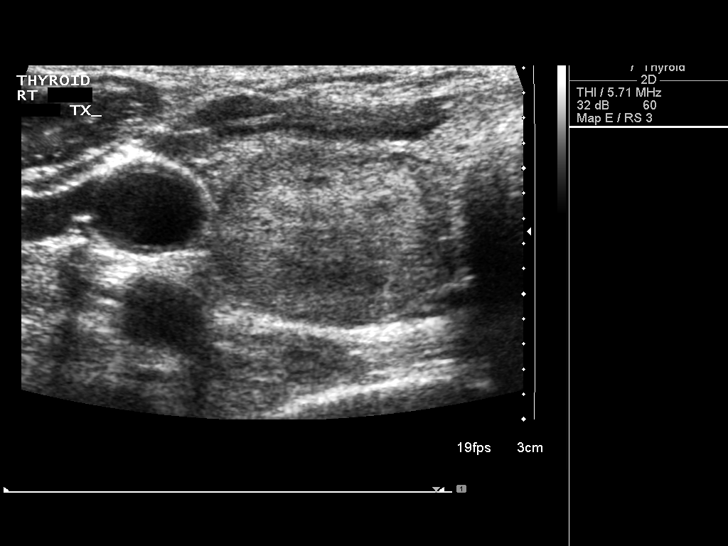
[im 13/14]
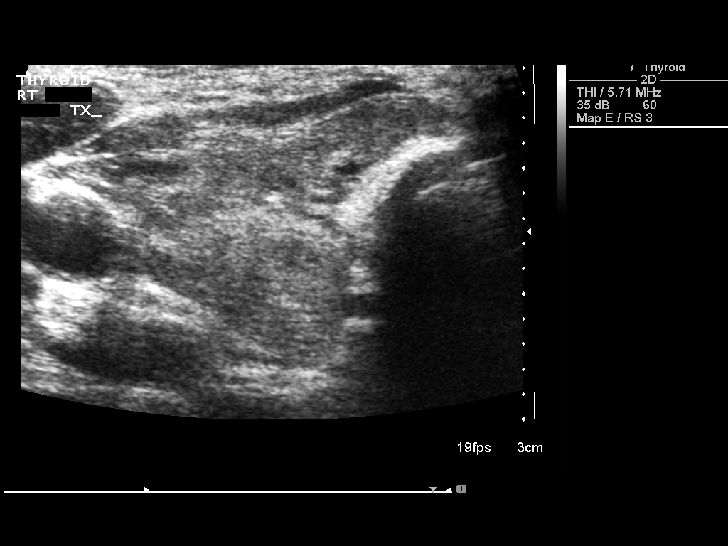
[im 14/14]
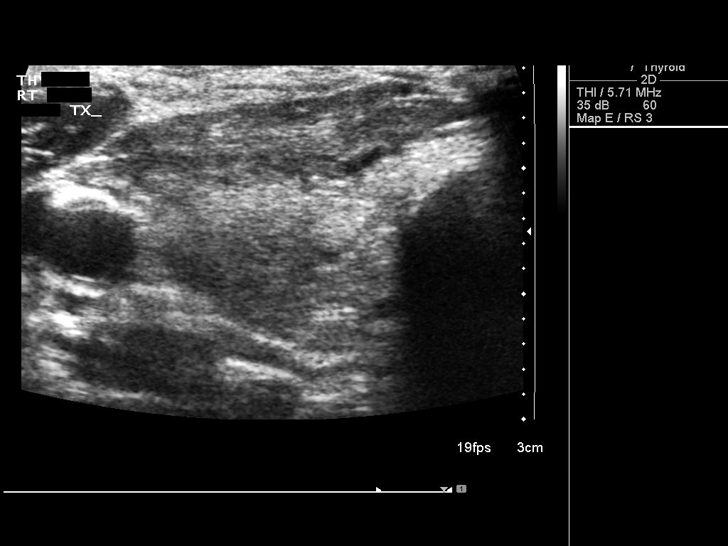

[13 of 14 positions shown; findings below may reference images not displayed]

Pre-procedural ultrasound scanning demonstrated right thyroid nodule

The procedure was planned. The neck was prepped in the usual sterile
fashion, and a sterile drape was applied covering the operative
field. A timeout was performed prior to the initiation of the
procedure. Local anesthesia was provided with 1% lidocaine.

Under direct ultrasound guidance, 4 FNA biopsies were performed of
the right thyroid nodule with a 25 gauge needle. The samples were
prepared and submitted to pathology.

Limited post procedural scanning was negative for hematoma or
additional complication. Dressings were placed. The patient
tolerated the above procedures procedure well without immediate
postprocedural complication.
IMPRESSION: Technically successful ultrasound guided fine needle aspiration of
right thyroid nodule

Read by:  Dimakatxo Dyantyi

## 2018-04-28 DIAGNOSIS — M25552 Pain in left hip: Secondary | ICD-10-CM | POA: Insufficient documentation

## 2018-07-12 ENCOUNTER — Telehealth: Payer: Self-pay | Admitting: Obstetrics and Gynecology

## 2018-07-12 NOTE — Telephone Encounter (Signed)
Spoke with patient, advised as seen below per Dr. Quincy Simmonds. Patient states she will discuss at [facility] on 08/25/18 at 11am. Patient verbalizes understanding.  Routing to Dr. Antony Blackbird. Encounter closed.

## 2018-07-12 NOTE — Telephone Encounter (Signed)
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  This is improved from 2017.   Her FRAX score indicates she is at increased risk of hip fracture and that medical therapy may be indicated to reduce risk of fracture.  Her risk if 7.2% over the next 10 years.   She may schedule an appointment to discuss this.  She also has an appointment for her annual in August.

## 2018-07-12 NOTE — Telephone Encounter (Signed)
Left message to call Mohamud Mrozek at [phone].  

## 2018-07-23 DIAGNOSIS — M545 Low back pain, unspecified: Secondary | ICD-10-CM | POA: Insufficient documentation

## 2018-08-03 ENCOUNTER — Encounter: Payer: Self-pay | Admitting: Obstetrics and Gynecology

## 2018-08-25 ENCOUNTER — Other Ambulatory Visit: Payer: Self-pay

## 2018-08-25 ENCOUNTER — Ambulatory Visit (INDEPENDENT_AMBULATORY_CARE_PROVIDER_SITE_OTHER): Payer: Medicare Other | Admitting: Obstetrics and Gynecology

## 2018-08-25 ENCOUNTER — Encounter: Payer: Self-pay | Admitting: Obstetrics and Gynecology

## 2018-08-25 VITALS — BP 122/70 | HR 72 | Resp 14 | Ht 62.25 in | Wt 109.0 lb

## 2018-08-25 DIAGNOSIS — Z01419 Encounter for gynecological examination (general) (routine) without abnormal findings: Secondary | ICD-10-CM

## 2018-08-25 DIAGNOSIS — Z124 Encounter for screening for malignant neoplasm of cervix: Secondary | ICD-10-CM

## 2018-08-25 DIAGNOSIS — N8110 Cystocele, unspecified: Secondary | ICD-10-CM | POA: Diagnosis not present

## 2018-08-25 DIAGNOSIS — Z4689 Encounter for fitting and adjustment of other specified devices: Secondary | ICD-10-CM

## 2018-08-25 MED ORDER — ESTRADIOL 0.1 MG/GM VA CREA: TOPICAL_CREAM | VAGINAL | 1 refills | Status: DC

## 2018-08-25 NOTE — Progress Notes (Signed)
72 y.o. G82P0011 Married Caucasian female here for annual exam.    Feels like she is in really good health.   States her prolapse is bothersome at the end of the day.  Has urinary frequency but difficulty passing her urine.  Occasionally removes and then replaces the pessary to see if it will make it better. Removes it once a week or less.  No vaginal bleeding or discharge.  Does have urinary incontinence with sneeze or cough.   States is it not possible to have the pessary out for a couple of days.   Does have an old pessary she does not use.   Tends to have more constipation.  Taking Ca with Mg which helps.  Using vaginal estrogen cream.   Does have hot flashes since stopping ERT.   PCP: Dr. Kathryne Eriksson    Patient's last menstrual period was 12/30/1983.           Sexually active: Yes.    The current method of family planning is status post hysterectomy.    Exercising: Yes.    walking Smoker:  no  Health Maintenance: Pap:  Years ago  History of abnormal Pap:  no MMG:  07/08/18 BIRADS 2 benign Colonoscopy:  2008 normal with Dr. Lenise Herald due 2018 BMD:   2019  Result  Osteopenia.  Hx Fosamax use.  Had reflux.  TDaP:  2014 Gardasil:   n/a HIV and Hep C: donated blood about 25 years ago Screening Labs:  PCP   reports that she quit smoking about 45 years ago. Her smoking use included cigars. She has never used smokeless tobacco. She reports that she drinks about 7.0 standard drinks of alcohol per week. She reports that she does not use drugs.  Past Medical History:  Diagnosis Date  . Cystocele 06/15/00  . Hypertension   . MVP (mitral valve prolapse)   . Osteoporosis    bilateral hips  . Thyroid nodule 05/06/16   right and left thyroid nodule     Past Surgical History:  Procedure Laterality Date  . back fusion  2004, 1990   lumbar  . BREAST BIOPSY Left   . TUBAL LIGATION     BTSP  . VAGINAL HYSTERECTOMY  1980's   Prolapsa    Current Outpatient Medications   Medication Sig Dispense Refill  . atenolol (TENORMIN) 50 MG tablet Take 50 mg by mouth daily.    . benazepril-hydrochlorthiazide (LOTENSIN HCT) 10-12.5 MG per tablet Take 1 tablet by mouth daily.    Marland Kitchen CALCIUM PO Take 800 mg by mouth daily.     . Cholecalciferol (VITAMIN D PO) Take 2,000 Units by mouth daily.    Marland Kitchen estradiol (ESTRACE) 0.1 MG/GM vaginal cream Use 1/2 g vaginally two or three times per week 42.5 g 0  . ibuprofen (ADVIL,MOTRIN) 200 MG tablet Take 1 tablet by mouth as needed.    . loteprednol (LOTEMAX) 0.5 % ophthalmic suspension as needed.     No current facility-administered medications for this visit.     Family History  Problem Relation Age of Onset  . Thyroid disease Sister   . Stroke Mother   . Breast cancer Other 44  . Hypertension Maternal Aunt     Review of Systems  All other systems reviewed and are negative.   Exam:   BP 122/70 (BP Location: Right Arm, Patient Position: Sitting, Cuff Size: Normal)   Pulse 72   Resp 14   Ht 5' 2.25" (1.581 m)   Wt 109  lb (49.4 kg)   LMP 12/30/1983   BMI 19.78 kg/m     General appearance: alert, cooperative and appears stated age Head: Normocephalic, without obvious abnormality, atraumatic Neck: no adenopathy, supple, symmetrical, trachea midline and thyroid normal to inspection and palpation Lungs: clear to auscultation bilaterally Breasts: normal appearance, no masses or tenderness, No nipple retraction or dimpling, No nipple discharge or bleeding, No axillary or supraclavicular adenopathy Heart: regular rate and rhythm.  Systolic murmur noted. Abdomen: soft, non-tender; no masses, no organomegaly Extremities: extremities normal, atraumatic, no cyanosis or edema Skin: Skin color, texture, turgor normal. No rashes or lesions Lymph nodes: Cervical, supraclavicular, and axillary nodes normal. No abnormal inguinal nodes palpated Neurologic: Grossly normal  Pelvic: External genitalia:  no lesions              Urethra:   normal appearing urethra with no masses, tenderness or lesions              Bartholins and Skenes: normal                 Vagina: normal appearing vagina with normal color and discharge, no lesions.  Third degree cystocele and first to second degree vaginal vault prolapse, first degree rectocele.               Cervix:  absent              Pap taken: No. Bimanual Exam:  Uterus:  Absent.              Adnexa: no mass, fullness, tenderness              Rectal exam: Yes.  .  Confirms.              Anus:  normal sphincter tone, no lesions  Incontinence dish pessary removed, cleansed, and replaced.   Chaperone was present for exam.  Assessment:   Well woman visit with normal exam. Status post hysterectomy.   Still has her ovaries.  Vaginal prolapse.  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  Pessary maintenance.  Osteopenia.  FH of breast cancer in nieces. Heart murmur.  MPV.   Plan: Mammogram screening. Recommended self breast awareness. Pap and HR HPV as above. Guidelines for Calcium, Vitamin D, regular exercise program including cardiovascular and weight bearing exercise. We discussed her prolapse and she will return for a pessary fitting.  She will try her old pessary in the mean time to see if she has a preference for one or the other.  Refill of vaginal estrogen.  I discussed potential effect on breast cancer.  Follow up annually and prn.   After visit summary provided.

## 2018-08-25 NOTE — Patient Instructions (Signed)

## 2018-09-15 ENCOUNTER — Ambulatory Visit (INDEPENDENT_AMBULATORY_CARE_PROVIDER_SITE_OTHER): Payer: Medicare Other | Admitting: Obstetrics and Gynecology

## 2018-09-15 ENCOUNTER — Other Ambulatory Visit: Payer: Self-pay

## 2018-09-15 ENCOUNTER — Encounter: Payer: Self-pay | Admitting: Obstetrics and Gynecology

## 2018-09-15 VITALS — BP 124/70 | Ht 62.25 in | Wt 108.0 lb

## 2018-09-15 DIAGNOSIS — N8110 Cystocele, unspecified: Secondary | ICD-10-CM

## 2018-09-15 NOTE — Progress Notes (Signed)
GYNECOLOGY  VISIT   HPI: 72 y.o.   Married  Caucasian  female   G3P0011 with Patient's last menstrual period was 12/30/1983.   here for pessary fitting.  Currently using a #2 incontinence dish. Used a #3 ring with support in the past.   Feels like it slips back in the vagina.  Has incontinence with sneeze or cough.  Some difficulty passing her urine.  Constipation.   Third degree cystocele and first to second degree vaginal vault prolapse, first degree rectocele.  GYNECOLOGIC HISTORY: Patient's last menstrual period was 12/30/1983. Contraception: hysterectomy  Menopausal hormone therapy:  estrace Last mammogram:  07/08/2018 Birads category 2 benign Last pap smear:   Unsure.  Was years ago.        OB History    Gravida  3   Para  2   Term      Preterm      AB  1   Living  1     SAB      TAB  1   Ectopic      Multiple      Live Births                 Patient Active Problem List   Diagnosis Date Noted  . Low back pain 07/23/2018  . Pain of left hip joint 04/28/2018  . Rotator cuff tendinitis, left 07/15/2017  . Need for influenza vaccination 12/19/2016  . Ganglion cyst of right foot 04/25/2016  . Low bone mass 04/25/2016  . Thyroid nodule 04/25/2016  . Benign essential hypertension 04/24/2016  . Foot deformity 04/24/2016  . Gastro-esophageal reflux disease with esophagitis 04/24/2016  . 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 Osteoarthritis 04/24/2016    Past Medical History:  Diagnosis Date  . Cystocele 06/15/00  . Hypertension   . MVP (mitral valve prolapse)   . Osteoporosis    bilateral hips  . Thyroid nodule 05/06/16   right and left thyroid nodule     Past Surgical History:  Procedure Laterality Date  . back fusion  2004, 1990   lumbar  . BREAST BIOPSY Left   . TUBAL LIGATION     BTSP  . VAGINAL HYSTERECTOMY  1980's   Prolapsa    Current Outpatient Medications  Medication Sig Dispense Refill  . atenolol (TENORMIN) 50 MG tablet  Take 50 mg by mouth daily.    . benazepril-hydrochlorthiazide (LOTENSIN HCT) 10-12.5 MG per tablet Take 1 tablet by mouth daily.    Marland Kitchen CALCIUM PO Take 800 mg by mouth daily.     . Cholecalciferol (VITAMIN D PO) Take 2,000 Units by mouth daily.    Marland Kitchen estradiol (ESTRACE) 0.1 MG/GM vaginal cream Use 1/2 g vaginally two or three times per week 42.5 g 1  . ibuprofen (ADVIL,MOTRIN) 200 MG tablet Take 1 tablet by mouth as needed.    . loteprednol (LOTEMAX) 0.5 % ophthalmic suspension as needed.     No current facility-administered medications for this visit.      ALLERGIES: Thimerosal  Family History  Problem Relation Age of Onset  . Thyroid disease Sister   . Stroke Mother   . Breast cancer Other 44  . Hypertension Maternal Aunt     Social History   Socioeconomic History  . Marital status: Married    Spouse name: Not on file  . Number of children: Not on file  . Years of education: Not on file  . Highest education level: Not on file  Occupational  History  . Not on file  Social Needs  . Financial resource strain: Not on file  . Food insecurity:    Worry: Not on file    Inability: Not on file  . Transportation needs:    Medical: Not on file    Non-medical: Not on file  Tobacco Use  . Smoking status: Former Smoker    Types: Cigars    Last attempt to quit: 05/09/1973    Years since quitting: 45.3  . Smokeless tobacco: Never Used  Substance and Sexual Activity  . Alcohol use: Yes    Alcohol/week: 7.0 standard drinks    Types: 7 Standard drinks or equivalent per week    Comment: a glass of wine at night, occ beer  . Drug use: No  . Sexual activity: Yes    Partners: Male    Birth control/protection: Post-menopausal, Surgical    Comment: TVH  Lifestyle  . Physical activity:    Days per week: Not on file    Minutes per session: Not on file  . Stress: Not on file  Relationships  . Social connections:    Talks on phone: Not on file    Gets together: Not on file    Attends  religious service: Not on file    Active member of club or organization: Not on file    Attends meetings of clubs or organizations: Not on file    Relationship status: Not on file  . Intimate partner violence:    Fear of current or ex partner: Not on file    Emotionally abused: Not on file    Physically abused: Not on file    Forced sexual activity: Not on file  Other Topics Concern  . Not on file  Social History Narrative  . Not on file    Review of Systems  Constitutional: Negative.   HENT: Negative.   Eyes: Negative.   Respiratory: Negative.   Cardiovascular: Negative.   Gastrointestinal: Negative.   Endocrine: Negative.   Genitourinary: Positive for frequency.       Loss of urine when sneeze or cough  Musculoskeletal: Negative.   Skin: Negative.   Allergic/Immunologic: Negative.   Neurological: Negative.   Hematological: Bruises/bleeds easily.  Psychiatric/Behavioral: Negative.   All other systems reviewed and are negative.   PHYSICAL EXAMINATION:    BP 124/70   Ht 5' 2.25" (1.581 m)   Wt 108 lb (49 kg)   LMP 12/30/1983   BMI 19.60 kg/m     General appearance: alert, cooperative and appears stated age   Pessary fitting Patient's #2 incontinence dish removed.  #4 ring with support tried and she can feel some pressure.  #3 incontinence dish tried and more comfortable for the patient.  Does not really feel much of anything. Able to do maneuvers and maintain the pessary.   Pelvic: External genitalia:  no lesions              Urethra:  normal appearing urethra with no masses, tenderness or lesions                       Bimanual Exam:  Uterus:  Absent.               Adnexa: no mass, fullness, tenderness            Chaperone was present for exam.  ASSESSMENT  Vaginal prolapse.   PLAN  Will order a #3 incontinence dish for patient.  She  will return for her follow up visit when this arrives.    An After Visit Summary was printed and given to the  patient.  ___15___ minutes face to face time of which over 50% was spent in counseling.

## 2018-09-28 NOTE — Progress Notes (Signed)
GYNECOLOGY  VISIT   HPI: 72 y.o.   Married  Caucasian  female   G3P0011 with Patient's last menstrual period was 12/30/1983.   here for pessary fitting.     Pessary #3 incontinence dish arrived from [place]: Patient's last menstrual period was 12/30/1983. Contraception:  hysterectomy Menopausal hormone therapy:  estrace Last mammogram:  07/08/2018 Birads 2, benign Last pap smear:   Years ago, hysterectomy        OB History    Gravida  3   Para  2   Term      Preterm      AB  1   Living  1     SAB      TAB  1   Ectopic      Multiple      Live Births                 Patient Active Problem List   Diagnosis Date Noted  . Low back pain 07/23/2018  . Pain of left hip joint 04/28/2018  . Rotator cuff tendinitis, left 07/15/2017  . Need for influenza vaccination 12/19/2016  . Ganglion cyst of right foot 04/25/2016  . Low bone mass 04/25/2016  . Thyroid nodule 04/25/2016  . Benign essential hypertension 04/24/2016  . Foot deformity 04/24/2016  . Gastro-esophageal reflux disease with esophagitis 04/24/2016  . 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 Osteoarthritis 04/24/2016    Past Medical History:  Diagnosis Date  . Cystocele 06/15/00  . Hypertension   . MVP (mitral valve prolapse)   . Osteoporosis    bilateral hips  . Thyroid nodule 05/06/16   right and left thyroid nodule     Past Surgical History:  Procedure Laterality Date  . back fusion  2004, 1990   lumbar  . BREAST BIOPSY Left   . TUBAL LIGATION     BTSP  . VAGINAL HYSTERECTOMY  1980's   Prolapsa    Current Outpatient Medications  Medication Sig Dispense Refill  . atenolol (TENORMIN) 50 MG tablet Take 50 mg by mouth daily.    . benazepril-hydrochlorthiazide (LOTENSIN HCT) 10-12.5 MG per tablet Take 1 tablet by mouth daily.    Marland Kitchen CALCIUM PO Take 800 mg by mouth daily.     . Cholecalciferol (VITAMIN D PO) Take 2,000 Units by mouth daily.    Marland Kitchen estradiol (ESTRACE)  0.1 MG/GM vaginal cream Use 1/2 g vaginally two or three times per week 42.5 g 1  . ibuprofen (ADVIL,MOTRIN) 200 MG tablet Take 1 tablet by mouth as needed.    . loteprednol (LOTEMAX) 0.5 % ophthalmic suspension as needed.     No current facility-administered medications for this visit.      ALLERGIES: Thimerosal  Family History  Problem Relation Age of Onset  . Thyroid disease Sister   . Stroke Mother   . Breast cancer Other 44  . Hypertension Maternal Aunt     Social History   Socioeconomic History  . Marital status: Married    Spouse name: Not on file  . Number of children: Not on file  . Years of education: Not on file  . Highest education level: Not on file  Occupational History  . Not on file  Social Needs  . Financial resource strain: Not on file  . Food insecurity:    Worry: Not on file    Inability: Not on file  . Transportation needs:    Medical: Not on file  Non-medical: Not on file  Tobacco Use  . Smoking status: Former Smoker    Types: Cigars    Last attempt to quit: 05/09/1973    Years since quitting: 45.4  . Smokeless tobacco: Never Used  Substance and Sexual Activity  . Alcohol use: Yes    Alcohol/week: 7.0 standard drinks    Types: 7 Standard drinks or equivalent per week    Comment: a glass of wine at night, occ beer  . Drug use: No  . Sexual activity: Yes    Partners: Male    Birth control/protection: Post-menopausal, Surgical    Comment: TVH  Lifestyle  . Physical activity:    Days per week: Not on file    Minutes per session: Not on file  . Stress: Not on file  Relationships  . Social connections:    Talks on phone: Not on file    Gets together: Not on file    Attends religious service: Not on file    Active member of club or organization: Not on file    Attends meetings of clubs or organizations: Not on file    Relationship status: Not on file  . Intimate partner violence:    Fear of current or ex partner: Not on file     Emotionally abused: Not on file    Physically abused: Not on file    Forced sexual activity: Not on file  Other Topics Concern  . Not on file  Social History Narrative  . Not on file    Review of Systems  PHYSICAL EXAMINATION:    BP 122/70   Pulse 62   Resp 16   Wt 108 lb (49 kg)   LMP 12/30/1983   SpO2 98%   BMI 19.60 kg/m     General appearance: alert, cooperative and appears stated age   ASSESSMENT  Vaginal prolapse.  PLAN  Will reorder pessary to be from [facility], which is most like the pessary she is currently using.  The size will be #3 incontinence dish.  We will call her when it arrives.    An After Visit Summary was printed and given to the patient.

## 2018-09-30 ENCOUNTER — Encounter: Payer: Self-pay | Admitting: Obstetrics and Gynecology

## 2018-09-30 ENCOUNTER — Other Ambulatory Visit: Payer: Self-pay

## 2018-09-30 ENCOUNTER — Ambulatory Visit (INDEPENDENT_AMBULATORY_CARE_PROVIDER_SITE_OTHER): Payer: Medicare Other | Admitting: Obstetrics and Gynecology

## 2018-09-30 VITALS — BP 122/70 | HR 62 | Resp 16 | Wt 108.0 lb

## 2018-09-30 DIAGNOSIS — N8110 Cystocele, unspecified: Secondary | ICD-10-CM

## 2018-10-04 NOTE — Progress Notes (Signed)
GYNECOLOGY  VISIT   HPI: 72 y.o.   Married  Caucasian  female   G3P0011 with Patient's last menstrual period was 12/30/1983.  Here for pessary insertion.  GYNECOLOGIC HISTORY: Patient's last menstrual period was 12/30/1983. Contraception:  Hysterectomy Menopausal hormone therapy: Estrace cream Last mammogram:  07/08/2018 Birads 2, benign Last pap smear: 07-08-18 Neg/Density B/biRads2        OB History    Gravida  3   Para  2   Term      Preterm      AB  1   Living  1     SAB      TAB  1   Ectopic      Multiple      Live Births                 Patient Active Problem List   Diagnosis Date Noted  . Low back pain 07/23/2018  . Pain of left hip joint 04/28/2018  . Rotator cuff tendinitis, left 07/15/2017  . Need for influenza vaccination 12/19/2016  . Ganglion cyst of right foot 04/25/2016  . Low bone mass 04/25/2016  . Thyroid nodule 04/25/2016  . Benign essential hypertension 04/24/2016  . Foot deformity 04/24/2016  . Gastro-esophageal reflux disease with esophagitis 04/24/2016  . 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 Osteoarthritis 04/24/2016    Past Medical History:  Diagnosis Date  . Cystocele 06/15/00  . Hypertension   . MVP (mitral valve prolapse)   . Osteoporosis    bilateral hips  . Thyroid nodule 05/06/16   right and left thyroid nodule     Past Surgical History:  Procedure Laterality Date  . back fusion  2004, 1990   lumbar  . BREAST BIOPSY Left   . TUBAL LIGATION     BTSP  . VAGINAL HYSTERECTOMY  1980's   Prolapsa    Current Outpatient Medications  Medication Sig Dispense Refill  . atenolol (TENORMIN) 50 MG tablet Take 50 mg by mouth daily.    . benazepril-hydrochlorthiazide (LOTENSIN HCT) 10-12.5 MG per tablet Take 1 tablet by mouth daily.    Marland Kitchen CALCIUM PO Take 800 mg by mouth daily.     . Cholecalciferol (VITAMIN D PO) Take 2,000 Units by mouth daily.    Marland Kitchen estradiol (ESTRACE) 0.1 MG/GM vaginal cream Use 1/2 g vaginally two or  three times per week 42.5 g 1  . ibuprofen (ADVIL,MOTRIN) 200 MG tablet Take 1 tablet by mouth as needed.    . loteprednol (LOTEMAX) 0.5 % ophthalmic suspension as needed.     No current facility-administered medications for this visit.      ALLERGIES: Thimerosal  Family History  Problem Relation Age of Onset  . Thyroid disease Sister   . Stroke Mother   . Breast cancer Other 44  . Hypertension Maternal Aunt     Social History   Socioeconomic History  . Marital status: Married    Spouse name: Not on file  . Number of children: Not on file  . Years of education: Not on file  . Highest education level: Not on file  Occupational History  . Not on file  Social Needs  . Financial resource strain: Not on file  . Food insecurity:    Worry: Not on file    Inability: Not on file  . Transportation needs:    Medical: Not on file    Non-medical: Not on file  Tobacco Use  . Smoking status: Former Smoker  Types: Cigars    Last attempt to quit: 05/09/1973    Years since quitting: 45.4  . Smokeless tobacco: Never Used  Substance and Sexual Activity  . Alcohol use: Yes    Alcohol/week: 7.0 standard drinks    Types: 7 Standard drinks or equivalent per week    Comment: a glass of wine at night, occ beer  . Drug use: No  . Sexual activity: Yes    Partners: Male    Birth control/protection: Post-menopausal, Surgical    Comment: TVH  Lifestyle  . Physical activity:    Days per week: Not on file    Minutes per session: Not on file  . Stress: Not on file  Relationships  . Social connections:    Talks on phone: Not on file    Gets together: Not on file    Attends religious service: Not on file    Active member of club or organization: Not on file    Attends meetings of clubs or organizations: Not on file    Relationship status: Not on file  . Intimate partner violence:    Fear of current or ex partner: Not on file    Emotionally abused: Not on file    Physically abused: Not  on file    Forced sexual activity: Not on file  Other Topics Concern  . Not on file  Social History Narrative  . Not on file    Review of Systems  Constitutional: Negative.   HENT: Negative.   Eyes: Negative.   Respiratory: Negative.   Gastrointestinal: Negative.   Endocrine: Negative.   Genitourinary: Negative.   Musculoskeletal: Negative.   Skin: Negative.   Allergic/Immunologic: Negative.   Neurological: Negative.   Hematological: Negative.   Psychiatric/Behavioral: Negative.   All other systems reviewed and are negative.   PHYSICAL EXAMINATION:    BP 122/76   Pulse 60   LMP 12/30/1983     General appearance: alert, cooperative and appears stated age   Pelvic: External genitalia:  no lesions              Urethra:  normal appearing urethra with no masses, tenderness or lesions              Bartholins and Skenes: normal                  Pessary #2 incontinence dish removed, cleansed, and placed in [facility].  New pessary #4, 70 mm Integra Miltex placed.  Lot number [id number].  Comfortable for patient.   Chaperone was present for exam.  ASSESSMENT   Third degree cystocele and first to second degree vaginal vault prolapse, first degree rectocele.  PLAN  New pessary placed today.  Patient familiar with placement and removal.  She will call for vaginal bleeding, pain or discomfort, or any other concerns.  Fu for annual exam and prn.    An After Visit Summary was printed and given to the patient.  _15_____ minutes face to face time of which over 50% was spent in counseling.

## 2018-10-06 ENCOUNTER — Ambulatory Visit (INDEPENDENT_AMBULATORY_CARE_PROVIDER_SITE_OTHER): Payer: Medicare Other | Admitting: Obstetrics and Gynecology

## 2018-10-06 ENCOUNTER — Encounter: Payer: Self-pay | Admitting: Obstetrics and Gynecology

## 2018-10-06 ENCOUNTER — Other Ambulatory Visit: Payer: Self-pay

## 2018-10-06 VITALS — BP 122/76 | HR 60

## 2018-10-06 DIAGNOSIS — N819 Female genital prolapse, unspecified: Secondary | ICD-10-CM | POA: Diagnosis not present

## 2018-10-06 NOTE — Patient Instructions (Signed)

## 2019-08-29 ENCOUNTER — Ambulatory Visit: Payer: Medicare Other | Admitting: Obstetrics and Gynecology

## 2019-10-13 ENCOUNTER — Other Ambulatory Visit: Payer: Self-pay

## 2019-10-13 DIAGNOSIS — Z20822 Contact with and (suspected) exposure to covid-19: Secondary | ICD-10-CM

## 2019-10-14 LAB — NOVEL CORONAVIRUS, NAA: SARS-CoV-2, NAA: NOT DETECTED

## 2019-11-08 ENCOUNTER — Encounter: Payer: Self-pay | Admitting: Obstetrics and Gynecology

## 2019-11-10 NOTE — Progress Notes (Signed)
73 y.o. G42P0011 Married Caucasian female here for annual exam.    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  She has an incontinence dish and uses it all of the time.  States it is effective and stays in well. It is more difficult to remove, and she does this monthly. Using vaginal estrogen cream only when she is removing and replacing the pessary.   No vaginal bleeding.  No vaginal discharge with odor.   Her bladder control is "pretty good." Can leak with a sneeze or certain movements. Does not need protection most of the time.   PCP: Kathryne Eriksson, MD  Patient's last menstrual period was 12/30/1983.           Sexually active: No.  The current method of family planning is status post hysterectomy.    Exercising: yes, walking.  Smoker:  no  Health Maintenance: Pap: Years ago History of abnormal Pap:  no MMG:  Last week at [facility]:normal per patient--call for report. Colonoscopy: 09/2019 cologuard with PCP but hasn't received results yet. BMD: 2019   Result :Osteopenia. Hx Fosamax use. Had reflux. TDaP:  2014 Gardasil:   no [id number] blood 25 years ago Hep C: Donated blood 25 years ago Screening Labs:  PCP Flu vaccine:  Completed.   reports that she quit smoking about 46 years ago. Her smoking use included cigars. She has never used smokeless tobacco. She reports current alcohol use of about 7.0 standard drinks of alcohol per week. She reports that she does not use drugs.  Past Medical History:  Diagnosis Date  . Cystocele 06/15/00  . Hypertension   . MVP (mitral valve prolapse)   . Osteoporosis    bilateral hips  . Thyroid nodule 05/06/16   right and left thyroid nodule     Past Surgical History:  Procedure Laterality Date  . back fusion  2004, 1990   lumbar  . BREAST BIOPSY Left   . TUBAL LIGATION     BTSP  . VAGINAL HYSTERECTOMY  1980's   Prolapsa    Current Outpatient Medications   Medication Sig Dispense Refill  . atenolol (TENORMIN) 50 MG tablet Take 50 mg by mouth daily.    . benazepril-hydrochlorthiazide (LOTENSIN HCT) 10-12.5 MG per tablet Take 1 tablet by mouth daily.    Marland Kitchen CALCIUM PO Take 800 mg by mouth daily.     . Cholecalciferol (VITAMIN D PO) Take 2,000 Units by mouth daily.    Marland Kitchen estradiol (ESTRACE) 0.1 MG/GM vaginal cream Use 1/2 g vaginally two or three times per week 42.5 g 1  . ibuprofen (ADVIL,MOTRIN) 200 MG tablet Take 1 tablet by mouth as needed.    . loteprednol (LOTEMAX) 0.5 % ophthalmic suspension as needed.     No current facility-administered medications for this visit.     Family History  Problem Relation Age of Onset  . Thyroid disease Sister   . Stroke Mother   . Breast cancer Other 44  . Hypertension Maternal Aunt     Review of Systems  All other systems reviewed and are negative.   Exam:   BP (!) 144/82   Pulse 60   Temp (!) 96.5 F (35.8 C) (Temporal)   Resp 14   Ht 5' 2.75" (1.594 m)   Wt 112 lb 6.4 oz (51 kg)   LMP 12/30/1983   BMI 20.07 kg/m     General appearance: alert, cooperative and appears stated age Head:  normocephalic, without obvious abnormality, atraumatic Neck: no adenopathy, supple, symmetrical, trachea midline and thyroid normal to inspection and palpation Lungs: clear to auscultation bilaterally Breasts: normal appearance, no masses or tenderness, No nipple retraction or dimpling, No nipple discharge or bleeding, No axillary adenopathy Heart: regular rate and rhythm Abdomen: soft, non-tender; no masses, no organomegaly Extremities: extremities normal, atraumatic, no cyanosis or edema Skin: skin color, texture, turgor normal. No rashes or lesions Lymph nodes: cervical, supraclavicular, and axillary nodes normal. Neurologic: grossly normal  Pelvic: External genitalia:  no lesions              No abnormal inguinal nodes palpated.              Urethra:  normal appearing urethra with no masses,  tenderness or lesions              Bartholins and Skenes: normal                 Vagina: normal appearing vagina with normal color and discharge, no lesions              Cervix: absent              Pap taken: No. Bimanual Exam:  Uterus:  absent              Adnexa: no mass, fullness, tenderness              Rectal exam: Yes.  .  Confirms.              Anus:  normal sphincter tone, no lesions  Pessary removed, cleansed, and replaced.   Chaperone was present for exam.  Assessment:   Well woman visit with normal exam. Status post hysterectomy. Still has her ovaries.  Vaginal prolapse.  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  Pessary maintenance.  Osteopenia.  FH of breast cancer in nieces. Heart murmur not heard today.  MPV.   Plan: Mammogram screening discussed. Self breast awareness reviewed. Pap and HR HPV as above. Guidelines for Calcium, Vitamin D, regular exercise program including cardiovascular and weight bearing exercise. BMD next year at the time of her mammogram.  Refill of vaginal estrogen cream.   I discussed potential effect on breast cancer.  Follow up annually and prn.   After visit summary provided.

## 2019-11-14 ENCOUNTER — Other Ambulatory Visit: Payer: Self-pay

## 2019-11-14 ENCOUNTER — Ambulatory Visit (INDEPENDENT_AMBULATORY_CARE_PROVIDER_SITE_OTHER): Payer: Medicare Other | Admitting: Obstetrics and Gynecology

## 2019-11-14 ENCOUNTER — Encounter: Payer: Self-pay | Admitting: Obstetrics and Gynecology

## 2019-11-14 VITALS — BP 144/82 | HR 60 | Temp 96.5°F | Resp 14 | Ht 62.75 in | Wt 112.4 lb

## 2019-11-14 DIAGNOSIS — Z124 Encounter for screening for malignant neoplasm of cervix: Secondary | ICD-10-CM

## 2019-11-14 DIAGNOSIS — Z01419 Encounter for gynecological examination (general) (routine) without abnormal findings: Secondary | ICD-10-CM | POA: Diagnosis not present

## 2019-11-14 MED ORDER — ESTRADIOL 0.1 MG/GM VA CREA: TOPICAL_CREAM | VAGINAL | 1 refills | Status: DC

## 2019-11-14 NOTE — Patient Instructions (Signed)

## 2019-11-21 ENCOUNTER — Other Ambulatory Visit: Payer: Self-pay

## 2019-11-21 DIAGNOSIS — Z20822 Contact with and (suspected) exposure to covid-19: Secondary | ICD-10-CM

## 2019-11-23 LAB — NOVEL CORONAVIRUS, NAA: SARS-CoV-2, NAA: NOT DETECTED

## 2020-01-20 ENCOUNTER — Ambulatory Visit: Payer: Self-pay

## 2020-01-20 ENCOUNTER — Ambulatory Visit: Payer: Medicare Other | Attending: Internal Medicine

## 2020-01-20 DIAGNOSIS — Z23 Encounter for immunization: Secondary | ICD-10-CM | POA: Insufficient documentation

## 2020-01-20 NOTE — Progress Notes (Signed)
   Covid-19 Vaccination Clinic  Name:  Tammy Bowen    MRN: [id number] DOB: 1946/09/05  01/20/2020  Tammy Bowen was observed post Covid-19 immunization for 15 minutes without incidence. She was provided with Vaccine Information Sheet and instruction to access the V-Safe system.   Tammy Bowen was instructed to call 911 with any severe reactions post vaccine: Marland Kitchen Difficulty breathing  . Swelling of your face and throat  . A fast heartbeat  . A bad rash all over your body  . Dizziness and weakness    Immunizations Administered    Name Date Dose VIS Date Route   Pfizer COVID-19 Vaccine 01/20/2020  3:54 PM 0.3 mL 12/09/2019 Intramuscular   Manufacturer: [place]   Lot: [id number]   [place]: [id number]

## 2020-02-10 ENCOUNTER — Ambulatory Visit: Payer: Medicare Other | Attending: Internal Medicine

## 2020-02-10 DIAGNOSIS — Z23 Encounter for immunization: Secondary | ICD-10-CM | POA: Insufficient documentation

## 2020-02-10 NOTE — Progress Notes (Signed)
   Covid-19 Vaccination Clinic  Name:  Tammy Bowen    MRN: [id number] DOB: 1946-12-20  02/10/2020  [name] was observed post Covid-19 immunization for 15 minutes without incidence. She was provided with Vaccine Information Sheet and instruction to access the V-Safe system.   [name] was instructed to call 911 with any severe reactions post vaccine: Marland Kitchen Difficulty breathing  . Swelling of your face and throat  . A fast heartbeat  . A bad rash all over your body  . Dizziness and weakness    Immunizations Administered    Name Date Dose VIS Date Route   Pfizer COVID-19 Vaccine 02/10/2020 12:09 AM 0.3 mL 12/09/2019 Intramuscular   Manufacturer: [place]   Lot: [id number]   [place]: [id number]

## 2020-11-14 NOTE — Progress Notes (Signed)
74 y.o. G12P0011 Married Caucasian female here for annual exam.    Patient occasionally sees bright red blood with wiping after bowel movement. Also complains of flatulence. Occurring over several months, off and on. No pain.  Has some constipation.   Using her pessary every day, and it is working well.  Removes it every 3 weeks.  No vaginal bleeding.  Using vaginal estrogen cream once a week.  Much less urinary incontinence with pessary use.   Going to [place] for Thanksgiving.   PCP: Kathryne Eriksson    Patient's last menstrual period was 12/30/1983.           Sexually active: No.  The current method of family planning is status post hysterectomy.    Exercising: No.  walks when nice outside--but she is a very active person Smoker:  no  Health Maintenance: Pap: Years ago History of abnormal Pap:  no MMG: 11-13-20 normal per patient with [facility] Colonoscopy: 09/2019 cologuard--Pt.thinks Neg.  GI is Dr. Benson [place].  BMD: 2019  Result :Osteopenia. Hx Fosamax use. Had reflux. TDaP:  2014 Gardasil:   no HIV: donated blood 25 years ago Hep C:donated blood 25 years ago Screening Labs:  PCP.    reports that she quit smoking about 47 years ago. Her smoking use included cigars. She has never used smokeless tobacco. She reports current alcohol use of about 7.0 standard drinks of alcohol per week. She reports that she does not use drugs.  Past Medical History:  Diagnosis Date  . Cystocele 06/15/00  . Hypertension   . MVP (mitral valve prolapse)   . Osteoporosis    bilateral hips  . Thyroid nodule 05/06/16   right and left thyroid nodule     Past Surgical History:  Procedure Laterality Date  . back fusion  2004, 1990   lumbar  . BREAST BIOPSY Left   . TUBAL LIGATION     BTSP  . VAGINAL HYSTERECTOMY  1980's   Prolapsa    Current Outpatient Medications  Medication Sig Dispense Refill  . atenolol (TENORMIN) 50 MG tablet Take 50 mg by mouth daily.    . B Complex Vitamins (VITAMIN B  COMPLEX) TABS Take 1 tablet by mouth daily.    Marland Kitchen CALCIUM PO Take 800 mg by mouth daily.     . Cholecalciferol (VITAMIN D PO) Take 2,000 Units by mouth daily.    Marland Kitchen estradiol (ESTRACE) 0.1 MG/GM vaginal cream Use 1/2 g vaginally two or three times per week 42.5 g 1  . hydrochlorothiazide (MICROZIDE) 12.5 MG capsule Take 12.5 mg by mouth daily.    Marland Kitchen ibuprofen (ADVIL,MOTRIN) 200 MG tablet Take 1 tablet by mouth as needed.    . loteprednol (LOTEMAX) 0.5 % ophthalmic suspension as needed.     No current facility-administered medications for this visit.    Family History  Problem Relation Age of Onset  . Thyroid disease Sister   . Stroke Mother   . Breast cancer Other 44  . Hypertension Maternal Aunt     Review of Systems  Gastrointestinal: Positive for anal bleeding.       Gas  All other systems reviewed and are negative.   Exam:   BP 140/68   Pulse 66   Ht 5\' 2"  (1.575 m)   Wt 109 lb (49.4 kg)   LMP 12/30/1983   SpO2 100%   BMI 19.94 kg/m     General appearance: alert, cooperative and appears stated age Head: normocephalic, without obvious abnormality, atraumatic Neck: no  adenopathy, supple, symmetrical, trachea midline and thyroid normal to inspection and palpation Lungs: clear to auscultation bilaterally Breasts: normal appearance, no masses or tenderness, No nipple retraction or dimpling, No nipple discharge or bleeding, No axillary adenopathy Heart: regular rate and rhythm Abdomen: soft, non-tender; no masses, no organomegaly Extremities: extremities normal, atraumatic, no cyanosis or edema Skin: skin color, texture, turgor normal. No rashes or lesions Lymph nodes: cervical, supraclavicular, and axillary nodes normal. Neurologic: grossly normal  Pelvic: External genitalia:  no lesions              No abnormal inguinal nodes palpated.              Urethra:  normal appearing urethra with no masses, tenderness or lesions              Bartholins and Skenes: normal                  Vagina: normal appearing vagina with normal color and discharge, no lesions              Cervix: absent              Pap taken: No. Bimanual Exam:  Uterus:  absent              Adnexa: no mass, fullness, tenderness              Rectal exam: Yes.  .  Confirms.              Anus:  normal sphincter tone, small hemorrhoid at 8:00?  Incontinence dish removed, cleansed, and replaced.   Chaperone was present for exam.  Assessment:   Well woman visit with normal exam. Status post hysterectomy. Still has her ovaries. Vaginal prolapse. 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  Pessary maintenance. Osteopenia. Hx osteoporosis.  FH of breast cancer in nieces. Rectal bleeding.  Plan: Mammogram screening discussed. Self breast awareness reviewed. Pap and HR HPV as above. Guidelines for Calcium, Vitamin D, regular exercise program including cardiovascular and weight bearing exercise. Refill of vaginal estrogen.  I discussed potential effect on breast cancer.  Try Colace daily.  Referral back to GI.  BMD at [facility]  Follow up annually and prn.   After visit summary provided.

## 2020-11-15 ENCOUNTER — Other Ambulatory Visit: Payer: Self-pay

## 2020-11-15 ENCOUNTER — Ambulatory Visit (INDEPENDENT_AMBULATORY_CARE_PROVIDER_SITE_OTHER): Payer: Medicare Other | Admitting: Obstetrics and Gynecology

## 2020-11-15 ENCOUNTER — Encounter: Payer: Self-pay | Admitting: Obstetrics and Gynecology

## 2020-11-15 VITALS — BP 140/68 | HR 66 | Ht 62.0 in | Wt 109.0 lb

## 2020-11-15 DIAGNOSIS — K625 Hemorrhage of anus and rectum: Secondary | ICD-10-CM | POA: Diagnosis not present

## 2020-11-15 DIAGNOSIS — Z01419 Encounter for gynecological examination (general) (routine) without abnormal findings: Secondary | ICD-10-CM | POA: Diagnosis not present

## 2020-11-15 MED ORDER — ESTRADIOL 0.1 MG/GM VA CREA: TOPICAL_CREAM | VAGINAL | 1 refills | Status: DC

## 2020-11-15 NOTE — Patient Instructions (Signed)

## 2021-09-04 ENCOUNTER — Ambulatory Visit (INDEPENDENT_AMBULATORY_CARE_PROVIDER_SITE_OTHER): Payer: Medicare Other | Admitting: Physician Assistant

## 2021-09-04 ENCOUNTER — Encounter: Payer: Self-pay | Admitting: Physician Assistant

## 2021-09-04 ENCOUNTER — Other Ambulatory Visit: Payer: Self-pay

## 2021-09-04 DIAGNOSIS — L570 Actinic keratosis: Secondary | ICD-10-CM

## 2021-09-04 DIAGNOSIS — D485 Neoplasm of uncertain behavior of skin: Secondary | ICD-10-CM

## 2021-09-04 NOTE — Progress Notes (Signed)
   Follow-Up Visit   Subjective  Tammy Bowen is a 75 y.o. female who presents for the following: Skin Problem (Here to have lesion on nose. Has a light patch X 1 year. Patients pcp gave her metronidazole cream. No bleeding, but patient says it has grown. No history of skin cancer. ).   The following portions of the chart were reviewed this encounter and updated as appropriate:  Tobacco  Allergies  Meds  Problems  Med Hx  Surg Hx  Fam Hx      Objective  Well appearing patient in no apparent distress; mood and affect are within normal limits.  A focused examination was performed including face. Relevant physical exam findings are noted in the Assessment and Plan.  Mid Supratip of Nose Slight xerosis with light pink discoloration.      Assessment & Plan  Neoplasm of uncertain behavior of skin Mid Supratip of Nose  Skin / nail biopsy Type of biopsy: tangential   Informed consent: discussed and consent obtained   Timeout: patient name, date of birth, surgical site, and procedure verified   Anesthesia: the lesion was anesthetized in a standard fashion   Anesthetic:  1% lidocaine w/ epinephrine 1-100,000 local infiltration Instrument used: flexible razor blade   Hemostasis achieved with: ferric subsulfate   Outcome: patient tolerated procedure well   Post-procedure details: wound care instructions given    Specimen 1 - Surgical pathology Differential Diagnosis: scc vs bcc  Check Margins: No    I, Hermila Millis, PA-C, have reviewed all documentation's for this visit.  The documentation on 09/04/21 for the exam, diagnosis, procedures and orders are all accurate and complete.

## 2021-09-04 NOTE — Patient Instructions (Signed)

## 2021-10-01 ENCOUNTER — Telehealth: Payer: Self-pay | Admitting: Physician Assistant

## 2021-10-01 NOTE — Telephone Encounter (Signed)
Patient is calling for pathology results from last visit with Kelli Sheffield, PA-C. 

## 2021-10-02 NOTE — Telephone Encounter (Signed)
Phone call to patient with her pathology results. Voicemail left for patient to give the office a call back.  

## 2021-10-02 NOTE — Telephone Encounter (Signed)
Phone call from patient returning our call. Pathology results given to patient.  

## 2021-11-25 ENCOUNTER — Encounter: Payer: Self-pay | Admitting: Obstetrics and Gynecology

## 2021-11-26 ENCOUNTER — Encounter: Payer: Self-pay | Admitting: Obstetrics and Gynecology

## 2021-11-27 ENCOUNTER — Ambulatory Visit: Payer: Medicare Other | Admitting: Obstetrics and Gynecology

## 2021-12-24 NOTE — Progress Notes (Deleted)
75 y.o. G79P0011 Married Caucasian female here for annual exam.    PCP:     Patient's last menstrual period was 12/30/1983.           Sexually active: {yes no:314532}  The current method of family planning is status post hysterectomy.    Exercising: {yes no:314532}  {types:19826} Smoker:  {YES NO:22349}  Health Maintenance: Pap:  yrs ago History of abnormal Pap:  no MMG:  11-19-21 category b density birads 1:neg Colonoscopy:  10/20 cologuard-neg BMD:   11-19-21  Result: osteoporosis of rt hip & osteopenia left hip TDaP:  2014 Gardasil:   n/a HIV: donated blood over 16yrs ago Hep C: donated blood over 39yrs ago Screening Labs:  Hb today: ***, Urine today: ***   reports that she quit smoking about 48 years ago. Her smoking use included cigars. She has never used smokeless tobacco. She reports current alcohol use of about 7.0 standard drinks per week. She reports that she does not use drugs.  Past Medical History:  Diagnosis Date   Cystocele 06/15/00   Hypertension    MVP (mitral valve prolapse)    Osteoporosis    bilateral hips   Thyroid nodule 05/06/16   right and left thyroid nodule     Past Surgical History:  Procedure Laterality Date   back fusion  2004, 1990   lumbar   BREAST BIOPSY Left    TUBAL LIGATION     BTSP   VAGINAL HYSTERECTOMY  1980's   Prolapsa    Current Outpatient Medications  Medication Sig Dispense Refill   atenolol (TENORMIN) 50 MG tablet Take 50 mg by mouth daily.     B Complex Vitamins (VITAMIN B COMPLEX) TABS Take 1 tablet by mouth daily.     CALCIUM PO Take 800 mg by mouth daily.      Cholecalciferol (VITAMIN D PO) Take 2,000 Units by mouth daily.     estradiol (ESTRACE) 0.1 MG/GM vaginal cream Use 1/2 g vaginally two or three times per week 42.5 g 1   hydrochlorothiazide (MICROZIDE) 12.5 MG capsule Take 12.5 mg by mouth daily.     ibuprofen (ADVIL,MOTRIN) 200 MG tablet Take 1 tablet by mouth as needed.     loteprednol (LOTEMAX) 0.5 %  ophthalmic suspension as needed.     No current facility-administered medications for this visit.    Family History  Problem Relation Age of Onset   Thyroid disease Sister    Stroke Mother    Breast cancer Other 81   Hypertension Maternal Aunt     Review of Systems  Exam:   LMP 12/30/1983     General appearance: alert, cooperative and appears stated age Head: normocephalic, without obvious abnormality, atraumatic Neck: no adenopathy, supple, symmetrical, trachea midline and thyroid normal to inspection and palpation Lungs: clear to auscultation bilaterally Breasts: normal appearance, no masses or tenderness, No nipple retraction or dimpling, No nipple discharge or bleeding, No axillary adenopathy Heart: regular rate and rhythm Abdomen: soft, non-tender; no masses, no organomegaly Extremities: extremities normal, atraumatic, no cyanosis or edema Skin: skin color, texture, turgor normal. No rashes or lesions Lymph nodes: cervical, supraclavicular, and axillary nodes normal. Neurologic: grossly normal  Pelvic: External genitalia:  no lesions              No abnormal inguinal nodes palpated.              Urethra:  normal appearing urethra with no masses, tenderness or lesions  Bartholins and Skenes: normal                 Vagina: normal appearing vagina with normal color and discharge, no lesions              Cervix: no lesions              Pap taken: {yes no:314532} Bimanual Exam:  Uterus:  normal size, contour, position, consistency, mobility, non-tender              Adnexa: no mass, fullness, tenderness              Rectal exam: {yes no:314532}.  Confirms.              Anus:  normal sphincter tone, no lesions  Chaperone was present for exam:  ***  Assessment:   Well woman visit with gynecologic exam.   Plan: Mammogram screening discussed. Self breast awareness reviewed. Pap and HR HPV as above. Guidelines for Calcium, Vitamin D, regular exercise program  including cardiovascular and weight bearing exercise.   Follow up annually and prn.   Additional counseling given.  {yes [id number]. _______ minutes face to face time of which over 50% was spent in counseling.    After visit summary provided.

## 2021-12-25 ENCOUNTER — Ambulatory Visit: Payer: Medicare Other | Admitting: Obstetrics and Gynecology

## 2022-01-09 NOTE — Progress Notes (Signed)
76 y.o. G33P0011 Married Caucasian female here for annual exam.    Patient uses a pessary and vaginal estrogen.  Pessary is a little hard to get in and out, but she is doing ok with it.  Removes it once every 3 weeks and notes odor when she takes it out. Has occasional discharge and maybe a little bit of urine leakage.   She has a third degree cystocele, first to second degree vault prolapse, first degree rectocele.  Can have some leakage with coughing.   No bleeding.   She is satisfied with the pessary overall.  She needs a refill of the vaginal estrogen cream.   Bone density from November showed osteoporosis.  She had reflux with Fosamax use in the past.  She is hesitant to treat osteoporosis with prescription medication.   Has had both Shingrix vaccines and current flu vaccine.  PCP:  Kathryne Eriksson, MD   Patient's last menstrual period was 12/30/1983.           Sexually active: No.  The current method of family planning is status post hysterectomy.    Exercising: No.   When the weather is good walks 2 miles 3x/week. Lives in 3 story house Smoker:  no  Health Maintenance: Pap:  years ago History of abnormal Pap:  no MMG:  11-19-21 Neg/Birads1 Colonoscopy:  12/2020 normal--aged out.09/2019 cologuard-- Neg.  GI is Dr. Benson Norway.  BMD: 11-26-21  Result :Osteoporosis of right hip and osteopenia of left hip TDaP:  2014 Gardasil:   no YIR:SWNIOEV blood in the past Hep C:donated blood in the past Screening Labs:  PCP. Bivalent booster for Covid:  completed. Shingrix:  completed.   reports that she quit smoking about 48 years ago. Her smoking use included cigars. She has never used smokeless tobacco. She reports current alcohol use of about 7.0 standard drinks per week. She reports that she does not use drugs.  Past Medical History:  Diagnosis Date   Cystocele 06/15/00   Hypertension    MVP (mitral valve prolapse)    Osteoporosis    bilateral hips   Thyroid nodule 05/06/16    right and left thyroid nodule     Past Surgical History:  Procedure Laterality Date   back fusion  2004, 1990   lumbar   BREAST BIOPSY Left    TUBAL LIGATION     BTSP   VAGINAL HYSTERECTOMY  1980's   Prolapsa    Current Outpatient Medications  Medication Sig Dispense Refill   atenolol (TENORMIN) 50 MG tablet Take 50 mg by mouth daily.     B Complex Vitamins (VITAMIN B COMPLEX) TABS Take 1 tablet by mouth daily.     CALCIUM PO Take 800 mg by mouth daily.      Cholecalciferol (VITAMIN D PO) Take 2,000 Units by mouth daily.     estradiol (ESTRACE) 0.1 MG/GM vaginal cream Use 1/2 g vaginally two or three times per week 42.5 g 1   hydrochlorothiazide (MICROZIDE) 12.5 MG capsule Take 12.5 mg by mouth daily.     ibuprofen (ADVIL,MOTRIN) 200 MG tablet Take 1 tablet by mouth as needed.     loteprednol (LOTEMAX) 0.5 % ophthalmic suspension as needed.     No current facility-administered medications for this visit.    Family History  Problem Relation Age of Onset   Thyroid disease Sister    Stroke Mother    Breast cancer Other 29   Hypertension Maternal Aunt     Review of Systems  All other systems reviewed and are negative.  Exam:   BP 126/66    Pulse (!) 59    Ht 5\' 2"  (1.575 m)    Wt 109 lb (49.4 kg)    LMP 12/30/1983    SpO2 100%    BMI 19.94 kg/m     General appearance: alert, cooperative and appears stated age Head: normocephalic, without obvious abnormality, atraumatic Neck: no adenopathy, supple, symmetrical, trachea midline and thyroid normal to inspection and palpation Lungs: clear to auscultation bilaterally Breasts: normal appearance, no masses or tenderness, No nipple retraction or dimpling, No nipple discharge or bleeding, No axillary adenopathy Heart: regular rate and rhythm Abdomen: soft, non-tender; no masses, no organomegaly Extremities: extremities normal, atraumatic, no cyanosis or edema Skin: skin color, texture, turgor normal. No rashes or lesions Lymph  nodes: cervical, supraclavicular, and axillary nodes normal. Neurologic: grossly normal  Pelvic: External genitalia:  no lesions              No abnormal inguinal nodes palpated.              Urethra:  normal appearing urethra with no masses, tenderness or lesions              Bartholins and Skenes: normal                 Vagina: minor erythema of the posterior vaginal wall, no lesions              Cervix: Absent              Pap taken: no Bimanual Exam:  Uterus: absent              Adnexa: no mass, fullness, tenderness              Rectal exam: yes.  Confirms.              Anus:  normal sphincter tone, no lesions  Incontinence dish pessary was removed, cleansed and replaced.   Chaperone was present for exam:  Estill Bamberg, CMA  Assessment:   Well woman visit with gynecologic exam. Status post hysterectomy.   Still has her ovaries.  Vaginal prolapse.  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  Pessary maintenance.  Osteoporosis.  FH of breast cancer in nieces.  Plan: Mammogram screening discussed. Self breast awareness reviewed. Pap and HR HPV as above. Guidelines for Calcium, Vitamin D, regular exercise program including cardiovascular and weight bearing exercise. Refill of vaginal estrogen.  Use 1/2 gram pv at hs 2 - 3 times per week. I discussed potential effect on breast cancer.  I would recommend leaving the pessary out overnight at least once per week.  BMD report reviewed.  She opts for no treatment of osteoporosis after discussing options.  Will repeat  BMD in 2 years. Fall risk reduction reviewed.  Follow up annually and prn.   After visit summary provided.   30 min  total time was spent for this patient encounter, including preparation, face-to-face counseling with the patient, coordination of care, and documentation of the encounter.

## 2022-01-10 ENCOUNTER — Encounter: Payer: Self-pay | Admitting: Obstetrics and Gynecology

## 2022-01-10 ENCOUNTER — Other Ambulatory Visit: Payer: Self-pay

## 2022-01-10 ENCOUNTER — Ambulatory Visit (INDEPENDENT_AMBULATORY_CARE_PROVIDER_SITE_OTHER): Payer: Medicare Other | Admitting: Obstetrics and Gynecology

## 2022-01-10 VITALS — BP 126/66 | HR 59 | Ht 62.0 in | Wt 109.0 lb

## 2022-01-10 DIAGNOSIS — Z4689 Encounter for fitting and adjustment of other specified devices: Secondary | ICD-10-CM | POA: Diagnosis not present

## 2022-01-10 DIAGNOSIS — M810 Age-related osteoporosis without current pathological fracture: Secondary | ICD-10-CM

## 2022-01-10 DIAGNOSIS — Z01419 Encounter for gynecological examination (general) (routine) without abnormal findings: Secondary | ICD-10-CM | POA: Diagnosis not present

## 2022-01-10 DIAGNOSIS — N8110 Cystocele, unspecified: Secondary | ICD-10-CM | POA: Diagnosis not present

## 2022-01-10 MED ORDER — ESTRADIOL 0.1 MG/GM VA CREA: TOPICAL_CREAM | VAGINAL | 1 refills | Status: DC

## 2022-01-10 NOTE — Patient Instructions (Signed)

## 2022-09-04 ENCOUNTER — Ambulatory Visit: Payer: Medicare Other | Admitting: Physician Assistant

## 2022-09-23 ENCOUNTER — Ambulatory Visit: Payer: Medicare Other | Admitting: Internal Medicine

## 2022-09-26 NOTE — Progress Notes (Signed)
Histology and Location of Primary Skin Cancer:    Tammy Bowen presents: to discuss pursuing radiation in place of Mohs surgery  Past/Anticipated interventions by patient's surgeon/dermatologist for current problematic lesion, if any:  09/23/2022 --Dr. Griselda Miner    Past skin cancers, if any: None  History of Blistering sunburns, if any: {:[id number]}  SAFETY ISSUES: Prior radiation? *** Pacemaker/ICD? *** Possible current pregnancy? No--hysterectomy Is the patient on methotrexate? ***  Current Complaints / other details:  ***

## 2022-09-29 NOTE — Progress Notes (Signed)
Radiation Oncology         [phone] ________________________________  Initial Outpatient Consultation  Name: Tammy Bowen MRN: [id number]  Date: 09/30/2022  DOB: 06/23/1946  CC:Christain Sacramento, MD  Griselda Miner, MD   REFERRING PHYSICIAN: Griselda Miner, MD  DIAGNOSIS:    ICD-10-CM   1. Basal cell carcinoma of nose  C44.311      Basal cell carcinoma of the nose tip   Cancer Staging  Basal cell carcinoma of nose Staging form: Cutaneous Carcinoma of the Head and Neck, AJCC 8th Edition - Clinical stage from 09/30/2022: Stage I (cT1, cN0, cM0) - Signed by Eppie Gibson, MD on 09/30/2022 Stage prefix: Initial diagnosis Extraosseous extension: Absent   CHIEF COMPLAINT: Here to discuss management of skin cancer  HISTORY OF PRESENT ILLNESS::Tammy Bowen is a 76 y.o. female who presented with to her PCP in July of 2022 for evaluation of a lesion on the tip of her nose that had been gradually increasing in size.  Ultimately, given a continued increase in size of the central nose lesion, the patient underwent biopsy of the lesion of 08/28/22. Pathology revealed nodular and infiltrative basal cell carcinoma.   Accordingly, the patient was referred to Dr. Sarajane Jews on 09/23/22 for further evaluation. During this visit, the patient endorsed a significant sun exposure history and reported daily ETOH consumption. Examination performed during this visit revealed a 1.5 cm papule/plaque on the right superior central nose tip. In terms of treatment options, the patient is already scheduled for Mohs procedure later this month. However, the patient expressed interest in learning more about radiation as a definitive treatment option, for which Dr. Sarajane Jews referred her to me to discuss in detail.    She is here with her significant other.  He appears very supportive.  She originally grew up in [place].  She has lived in [place] in the past.  Photos provided by dermatology:   PREVIOUS  RADIATION THERAPY: No  PAST MEDICAL HISTORY:  has a past medical history of Cystocele (06/15/00), Hypertension, MVP (mitral valve prolapse), Osteoporosis, and Thyroid nodule (05/06/16).    PAST SURGICAL HISTORY: Past Surgical History:  Procedure Laterality Date   back fusion  2004, 1990   lumbar   BREAST BIOPSY Left    TUBAL LIGATION     BTSP   VAGINAL HYSTERECTOMY  1980's   Prolapsa    FAMILY HISTORY: family history includes Breast cancer in her niece; Breast cancer (age of onset: 73) in her niece; Hypertension in her maternal aunt; Stroke in her mother; Thyroid disease in her sister.  SOCIAL HISTORY:  reports that she quit smoking about 49 years ago. Her smoking use included cigars. She has never used smokeless tobacco. She reports current alcohol use of about 7.0 standard drinks of alcohol per week. She reports that she does not use drugs.  ALLERGIES: Thimerosal (thiomersal)  MEDICATIONS:  Current Outpatient Medications  Medication Sig Dispense Refill   losartan (COZAAR) 50 MG tablet Take 1 tablet by mouth daily.     atenolol (TENORMIN) 50 MG tablet Take 50 mg by mouth daily.     B Complex Vitamins (VITAMIN B COMPLEX) TABS Take 1 tablet by mouth daily.     CALCIUM PO Take 800 mg by mouth daily.      Cholecalciferol (VITAMIN D PO) Take 2,000 Units by mouth daily.     Difluprednate 0.05 % EMUL Place 1 drop into both eyes daily.     estradiol (ESTRACE) 0.1 MG/GM vaginal cream  Use 1/2 g vaginally two or three times per week 42.5 g 1   hydrochlorothiazide (MICROZIDE) 12.5 MG capsule Take 12.5 mg by mouth daily.     ibuprofen (ADVIL,MOTRIN) 200 MG tablet Take 1 tablet by mouth as needed.     loteprednol (LOTEMAX) 0.5 % ophthalmic suspension as needed.     No current facility-administered medications for this encounter.    REVIEW OF SYSTEMS:  Notable for that above.   PHYSICAL EXAM:  height is '5\' 2"'$  (1.575 m) and weight is 108 lb 4 oz (49.1 kg). Her temporal temperature is 97.4 F  (36.3 C) (abnormal). Her blood pressure is 142/61 (abnormal) and her pulse is 63. Her respiration is 18 and oxygen saturation is 100%.   General: Alert and oriented, in no acute distress  HEENT: Head is normocephalic. Extraocular movements are intact.   Neck: Neck is supple, no palpable cervical or supraclavicular lymphadenopathy. Heart: Regular in rate and rhythm with no murmurs, rubs, or gallops. Chest: Clear to auscultation bilaterally, with no rhonchi, wheezes, or rales. Extremities: No cyanosis or edema. Lymphatics: see Neck Exam Skin: Small erythematous concave lesion at left tip of nose consistent photos above Musculoskeletal: Ambulatory.  Well-nourished Neurologic: Cranial nerves II through XII are grossly intact. No obvious focalities. Speech is fluent. Coordination is intact. Psychiatric: Judgment and insight are intact. Affect is appropriate.   ECOG = 0  0 - Asymptomatic (Fully active, able to carry on all predisease activities without restriction)  1 - Symptomatic but completely ambulatory (Restricted in physically strenuous activity but ambulatory and able to carry out work of a light or sedentary nature. For example, light housework, office work)  2 - Symptomatic, <50% in bed during the day (Ambulatory and capable of all self care but unable to carry out any work activities. Up and about more than 50% of waking hours)  3 - Symptomatic, >50% in bed, but not bedbound (Capable of only limited self-care, confined to bed or chair 50% or more of waking hours)  4 - Bedbound (Completely disabled. Cannot carry on any self-care. Totally confined to bed or chair)  5 - Death   Eustace Pen MM, Creech RH, Tormey DC, et al. [id number]). "Toxicity and response criteria of the [facility]". [place] Oncol. 5 (6): 649-55   LABORATORY DATA:  Lab Results  Component Value Date   WBC 4.5 05/10/2014   HGB 12.1 05/10/2014   HCT 37.5 05/10/2014   MCV 88.7 05/10/2014   PLT  265 05/10/2014   CMP     Component Value Date/Time   NA 139 05/10/2014 0901   K 4.2 05/10/2014 0901   CL 102 05/10/2014 0901   CO2 29 05/10/2014 0901   GLUCOSE 95 05/10/2014 0901   BUN 18 05/10/2014 0901   CREATININE 0.93 05/10/2014 0901   CALCIUM 8.8 05/10/2014 0901   PROT 7.1 05/10/2014 0901   ALBUMIN 3.9 05/10/2014 0901   AST 21 05/10/2014 0901   ALT 13 05/10/2014 0901   ALKPHOS 44 05/10/2014 0901   BILITOT 0.4 05/10/2014 0901        RADIOGRAPHY: No results found.    IMPRESSION/PLAN: Basal cell carcinoma of nose  Today, I talked to the patient about the findings and work-up thus far.   She is interested in nonsurgical treatment options.  We discussed the patient's diagnosis of basal cell carcinoma of the nose and general treatment for this, highlighting the role of radiotherapy in the management.  We discussed the available radiation  techniques, and focused on the details of logistics and delivery.     We discussed that to treatment regimens are commonly pursued with curative intent.   She is a good candidate for 4 weeks of curative intent radiation therapy with electrons.  We discussed the risks, benefits, and side effects of radiotherapy. Side effects may include but not necessarily be limited to: Skin irritation, permanent loss of nose hair, nostril irritation, bleeding from the nostril, fatigue, rare permanent injury to tissues of the nose/cartilage, permanent skin changes; no guarantees of treatment were given. A consent form was signed and placed in the patient's medical record. The patient was encouraged to ask questions that I answered to the best of my ability.    She is very enthusiastic about proceeding with treatment planning.  We will get her scheduled in the near future.   She has a social event planned later this month and would prefer to start on October 23.  We can certainly work with that schedule.   On date of service, in total, I spent 60 minutes on this  encounter. Patient was seen in person.   __________________________________________   Eppie Gibson, MD  This document serves as a record of services personally performed by Eppie Gibson, MD. It was created on her behalf by Roney Mans, a trained medical scribe. The creation of this record is based on the scribe's personal observations and the provider's statements to them. This document has been checked and approved by the attending provider.

## 2022-09-30 ENCOUNTER — Ambulatory Visit
Admission: RE | Admit: 2022-09-30 | Discharge: 2022-09-30 | Disposition: A | Discharge status: Home / Self Care | Payer: Medicare Other | Source: Ambulatory Visit | Attending: Radiation Oncology | Admitting: Radiation Oncology

## 2022-09-30 ENCOUNTER — Other Ambulatory Visit: Payer: Self-pay

## 2022-09-30 ENCOUNTER — Encounter: Payer: Self-pay | Admitting: Radiation Oncology

## 2022-09-30 DIAGNOSIS — C44311 Basal cell carcinoma of skin of nose: Secondary | ICD-10-CM | POA: Insufficient documentation

## 2022-09-30 DIAGNOSIS — I10 Essential (primary) hypertension: Secondary | ICD-10-CM | POA: Diagnosis not present

## 2022-09-30 DIAGNOSIS — M810 Age-related osteoporosis without current pathological fracture: Secondary | ICD-10-CM | POA: Insufficient documentation

## 2022-09-30 DIAGNOSIS — E041 Nontoxic single thyroid nodule: Secondary | ICD-10-CM | POA: Insufficient documentation

## 2022-09-30 DIAGNOSIS — Z87891 Personal history of nicotine dependence: Secondary | ICD-10-CM | POA: Insufficient documentation

## 2022-09-30 DIAGNOSIS — I341 Nonrheumatic mitral (valve) prolapse: Secondary | ICD-10-CM | POA: Insufficient documentation

## 2022-09-30 DIAGNOSIS — Z79899 Other long term (current) drug therapy: Secondary | ICD-10-CM | POA: Diagnosis not present

## 2022-09-30 NOTE — Progress Notes (Signed)
Oncology Nurse Navigator Documentation   Met with patient during initial consult with Dr. Isidore Moos. She was accompanied by her husband.  I introduced myself as her/their Navigator, explained my role as a member of the Care Team. I toured them to [place] treatment area, explained procedures for lobby registration, arrival to Radiation Waiting, and arrival to treatment area.  She voiced understanding.   They verbalized understanding of information provided. I encouraged them to call with questions/concerns moving forward.  Harlow Asa, RN, BSN, OCN Head & Neck Oncology Nurse [place] at [place] [phone]

## 2022-10-08 ENCOUNTER — Ambulatory Visit
Admission: RE | Admit: 2022-10-08 | Discharge: 2022-10-08 | Disposition: A | Discharge status: Home / Self Care | Payer: Medicare Other | Source: Ambulatory Visit | Attending: Radiation Oncology | Admitting: Radiation Oncology

## 2022-10-08 ENCOUNTER — Other Ambulatory Visit: Payer: Self-pay

## 2022-10-08 DIAGNOSIS — Z510 Encounter for antineoplastic radiation therapy: Secondary | ICD-10-CM | POA: Diagnosis present

## 2022-10-08 DIAGNOSIS — C44311 Basal cell carcinoma of skin of nose: Secondary | ICD-10-CM | POA: Diagnosis present

## 2022-10-14 DIAGNOSIS — Z510 Encounter for antineoplastic radiation therapy: Secondary | ICD-10-CM | POA: Diagnosis not present

## 2022-10-20 ENCOUNTER — Ambulatory Visit
Admission: RE | Admit: 2022-10-20 | Discharge: 2022-10-20 | Disposition: A | Discharge status: Home / Self Care | Payer: Medicare Other | Source: Ambulatory Visit | Attending: Radiation Oncology | Admitting: Radiation Oncology

## 2022-10-20 ENCOUNTER — Other Ambulatory Visit: Payer: Self-pay

## 2022-10-20 DIAGNOSIS — Z510 Encounter for antineoplastic radiation therapy: Secondary | ICD-10-CM | POA: Diagnosis not present

## 2022-10-20 LAB — RAD ONC ARIA SESSION SUMMARY
Course Elapsed Days: 0
Plan Fractions Treated to Date: 1
Plan Prescribed Dose Per Fraction: 2.5 Gy
Plan Total Fractions Prescribed: 20
Plan Total Prescribed Dose: 50 Gy
Reference Point Dosage Given to Date: 2.5 Gy
Reference Point Session Dosage Given: 2.5 Gy
Session Number: 1

## 2022-10-20 MED ORDER — SONAFINE EX EMUL: 1.0000 | Freq: Two times a day (BID) | CUTANEOUS | Status: DC

## 2022-10-20 NOTE — Progress Notes (Signed)
Oncology Nurse Navigator Documentation   Ms. Schweer completed her first radiation treatment without difficulty today. She has my direct contact information to call for any needs.  Thanks!  Harlow Asa RN, BSN, OCN Head & Neck Oncology Nurse [place] at [facility] Phone # [phone]  Fax # [phone]

## 2022-10-20 NOTE — Progress Notes (Signed)
Pt here for patient teaching.    Pt given Radiation and You booklet, skin care instructions, and Sonafine.    Reviewed areas of pertinence such as fatigue, hair loss in treatment field, mouth changes, skin changes, throat changes, and taste changes .   Pt able to give teach back of drink plenty of water,apply Sonafine bid and avoid applying anything to skin within 4 hours of treatment.   Pt verbalizes understanding of information given and will contact nursing with any questions or concerns.    [email or web address]         

## 2022-10-20 NOTE — Progress Notes (Deleted)
Pt here for patient teaching.    Pt given Radiation and You booklet, skin care instructions, Alra deodorant, and Radiaplex gel.    Reviewed areas of pertinence such as fatigue, skin changes, breast tenderness, and breast swelling .   Pt able to give teach back of to pat skin and use unscented/gentle soap,apply Radiaplex bid, avoid applying anything to skin within 4 hours of treatment, avoid wearing an under wire bra, and to use an electric razor if they must shave.   Pt verbalizes understanding of information given and will contact nursing with any questions or concerns.    [email or web address]

## 2022-10-21 ENCOUNTER — Other Ambulatory Visit: Payer: Self-pay

## 2022-10-21 ENCOUNTER — Ambulatory Visit
Admission: RE | Admit: 2022-10-21 | Discharge: 2022-10-21 | Disposition: A | Discharge status: Home / Self Care | Payer: Medicare Other | Source: Ambulatory Visit | Attending: Radiation Oncology | Admitting: Radiation Oncology

## 2022-10-21 DIAGNOSIS — Z510 Encounter for antineoplastic radiation therapy: Secondary | ICD-10-CM | POA: Diagnosis not present

## 2022-10-21 LAB — RAD ONC ARIA SESSION SUMMARY
Course Elapsed Days: 1
Plan Fractions Treated to Date: 2
Plan Prescribed Dose Per Fraction: 2.5 Gy
Plan Total Fractions Prescribed: 20
Plan Total Prescribed Dose: 50 Gy
Reference Point Dosage Given to Date: 5 Gy
Reference Point Session Dosage Given: 2.5 Gy
Session Number: 2

## 2022-10-22 ENCOUNTER — Other Ambulatory Visit: Payer: Self-pay

## 2022-10-22 ENCOUNTER — Ambulatory Visit
Admission: RE | Admit: 2022-10-22 | Discharge: 2022-10-22 | Disposition: A | Discharge status: Home / Self Care | Payer: Medicare Other | Source: Ambulatory Visit | Attending: Radiation Oncology | Admitting: Radiation Oncology

## 2022-10-22 DIAGNOSIS — Z510 Encounter for antineoplastic radiation therapy: Secondary | ICD-10-CM | POA: Diagnosis not present

## 2022-10-22 LAB — RAD ONC ARIA SESSION SUMMARY
Course Elapsed Days: 2
Plan Fractions Treated to Date: 3
Plan Prescribed Dose Per Fraction: 2.5 Gy
Plan Total Fractions Prescribed: 20
Plan Total Prescribed Dose: 50 Gy
Reference Point Dosage Given to Date: 7.5 Gy
Reference Point Session Dosage Given: 2.5 Gy
Session Number: 3

## 2022-10-23 ENCOUNTER — Other Ambulatory Visit: Payer: Self-pay

## 2022-10-23 ENCOUNTER — Ambulatory Visit
Admission: RE | Admit: 2022-10-23 | Discharge: 2022-10-23 | Disposition: A | Discharge status: Home / Self Care | Payer: Medicare Other | Source: Ambulatory Visit | Attending: Radiation Oncology | Admitting: Radiation Oncology

## 2022-10-23 DIAGNOSIS — Z510 Encounter for antineoplastic radiation therapy: Secondary | ICD-10-CM | POA: Diagnosis not present

## 2022-10-23 LAB — RAD ONC ARIA SESSION SUMMARY
Course Elapsed Days: 3
Plan Fractions Treated to Date: 4
Plan Prescribed Dose Per Fraction: 2.5 Gy
Plan Total Fractions Prescribed: 20
Plan Total Prescribed Dose: 50 Gy
Reference Point Dosage Given to Date: 10 Gy
Reference Point Session Dosage Given: 2.5 Gy
Session Number: 4

## 2022-10-24 ENCOUNTER — Other Ambulatory Visit: Payer: Self-pay

## 2022-10-24 ENCOUNTER — Ambulatory Visit
Admission: RE | Admit: 2022-10-24 | Discharge: 2022-10-24 | Disposition: A | Discharge status: Home / Self Care | Payer: Medicare Other | Source: Ambulatory Visit | Attending: Radiation Oncology | Admitting: Radiation Oncology

## 2022-10-24 DIAGNOSIS — Z510 Encounter for antineoplastic radiation therapy: Secondary | ICD-10-CM | POA: Diagnosis not present

## 2022-10-24 LAB — RAD ONC ARIA SESSION SUMMARY
Course Elapsed Days: 4
Plan Fractions Treated to Date: 5
Plan Prescribed Dose Per Fraction: 2.5 Gy
Plan Total Fractions Prescribed: 20
Plan Total Prescribed Dose: 50 Gy
Reference Point Dosage Given to Date: 12.5 Gy
Reference Point Session Dosage Given: 2.5 Gy
Session Number: 5

## 2022-10-27 ENCOUNTER — Other Ambulatory Visit: Payer: Self-pay

## 2022-10-27 ENCOUNTER — Ambulatory Visit: Payer: Medicare Other

## 2022-10-27 ENCOUNTER — Ambulatory Visit
Admission: RE | Admit: 2022-10-27 | Discharge: 2022-10-27 | Disposition: A | Discharge status: Home / Self Care | Payer: Medicare Other | Source: Ambulatory Visit | Attending: Radiation Oncology | Admitting: Radiation Oncology

## 2022-10-27 DIAGNOSIS — Z510 Encounter for antineoplastic radiation therapy: Secondary | ICD-10-CM | POA: Diagnosis not present

## 2022-10-27 LAB — RAD ONC ARIA SESSION SUMMARY
Course Elapsed Days: 7
Plan Fractions Treated to Date: 6
Plan Prescribed Dose Per Fraction: 2.5 Gy
Plan Total Fractions Prescribed: 20
Plan Total Prescribed Dose: 50 Gy
Reference Point Dosage Given to Date: 15 Gy
Reference Point Session Dosage Given: 2.5 Gy
Session Number: 6

## 2022-10-28 ENCOUNTER — Ambulatory Visit
Admission: RE | Admit: 2022-10-28 | Discharge: 2022-10-28 | Disposition: A | Discharge status: Home / Self Care | Payer: Medicare Other | Source: Ambulatory Visit | Attending: Radiation Oncology | Admitting: Radiation Oncology

## 2022-10-28 ENCOUNTER — Other Ambulatory Visit: Payer: Self-pay

## 2022-10-28 DIAGNOSIS — Z510 Encounter for antineoplastic radiation therapy: Secondary | ICD-10-CM | POA: Diagnosis not present

## 2022-10-28 LAB — RAD ONC ARIA SESSION SUMMARY
Course Elapsed Days: 8
Plan Fractions Treated to Date: 7
Plan Prescribed Dose Per Fraction: 2.5 Gy
Plan Total Fractions Prescribed: 20
Plan Total Prescribed Dose: 50 Gy
Reference Point Dosage Given to Date: 17.5 Gy
Reference Point Session Dosage Given: 2.5 Gy
Session Number: 7

## 2022-10-29 ENCOUNTER — Other Ambulatory Visit: Payer: Self-pay

## 2022-10-29 ENCOUNTER — Ambulatory Visit
Admission: RE | Admit: 2022-10-29 | Discharge: 2022-10-29 | Disposition: A | Discharge status: Home / Self Care | Payer: Medicare Other | Source: Ambulatory Visit | Attending: Radiation Oncology | Admitting: Radiation Oncology

## 2022-10-29 DIAGNOSIS — Z510 Encounter for antineoplastic radiation therapy: Secondary | ICD-10-CM | POA: Insufficient documentation

## 2022-10-29 DIAGNOSIS — C44311 Basal cell carcinoma of skin of nose: Secondary | ICD-10-CM | POA: Insufficient documentation

## 2022-10-29 LAB — RAD ONC ARIA SESSION SUMMARY
Course Elapsed Days: 9
Plan Fractions Treated to Date: 8
Plan Prescribed Dose Per Fraction: 2.5 Gy
Plan Total Fractions Prescribed: 20
Plan Total Prescribed Dose: 50 Gy
Reference Point Dosage Given to Date: 20 Gy
Reference Point Session Dosage Given: 2.5 Gy
Session Number: 8

## 2022-10-30 ENCOUNTER — Other Ambulatory Visit: Payer: Self-pay

## 2022-10-30 ENCOUNTER — Ambulatory Visit
Admission: RE | Admit: 2022-10-30 | Discharge: 2022-10-30 | Disposition: A | Discharge status: Home / Self Care | Payer: Medicare Other | Source: Ambulatory Visit | Attending: Radiation Oncology | Admitting: Radiation Oncology

## 2022-10-30 DIAGNOSIS — Z510 Encounter for antineoplastic radiation therapy: Secondary | ICD-10-CM | POA: Diagnosis not present

## 2022-10-30 LAB — RAD ONC ARIA SESSION SUMMARY
Course Elapsed Days: 10
Plan Fractions Treated to Date: 9
Plan Prescribed Dose Per Fraction: 2.5 Gy
Plan Total Fractions Prescribed: 20
Plan Total Prescribed Dose: 50 Gy
Reference Point Dosage Given to Date: 22.5 Gy
Reference Point Session Dosage Given: 2.5 Gy
Session Number: 9

## 2022-10-31 ENCOUNTER — Ambulatory Visit
Admission: RE | Admit: 2022-10-31 | Discharge: 2022-10-31 | Disposition: A | Discharge status: Home / Self Care | Payer: Medicare Other | Source: Ambulatory Visit | Attending: Radiation Oncology | Admitting: Radiation Oncology

## 2022-10-31 ENCOUNTER — Other Ambulatory Visit: Payer: Self-pay

## 2022-10-31 DIAGNOSIS — Z510 Encounter for antineoplastic radiation therapy: Secondary | ICD-10-CM | POA: Diagnosis not present

## 2022-10-31 LAB — RAD ONC ARIA SESSION SUMMARY
Course Elapsed Days: 11
Plan Fractions Treated to Date: 10
Plan Prescribed Dose Per Fraction: 2.5 Gy
Plan Total Fractions Prescribed: 20
Plan Total Prescribed Dose: 50 Gy
Reference Point Dosage Given to Date: 25 Gy
Reference Point Session Dosage Given: 2.5 Gy
Session Number: 10

## 2022-11-03 ENCOUNTER — Other Ambulatory Visit: Payer: Self-pay

## 2022-11-03 ENCOUNTER — Ambulatory Visit: Payer: Medicare Other

## 2022-11-03 ENCOUNTER — Ambulatory Visit
Admission: RE | Admit: 2022-11-03 | Discharge: 2022-11-03 | Disposition: A | Discharge status: Home / Self Care | Payer: Medicare Other | Source: Ambulatory Visit | Attending: Radiation Oncology | Admitting: Radiation Oncology

## 2022-11-03 DIAGNOSIS — Z510 Encounter for antineoplastic radiation therapy: Secondary | ICD-10-CM | POA: Diagnosis not present

## 2022-11-03 LAB — RAD ONC ARIA SESSION SUMMARY
Course Elapsed Days: 14
Plan Fractions Treated to Date: 11
Plan Prescribed Dose Per Fraction: 2.5 Gy
Plan Total Fractions Prescribed: 20
Plan Total Prescribed Dose: 50 Gy
Reference Point Dosage Given to Date: 27.5 Gy
Reference Point Session Dosage Given: 2.5 Gy
Session Number: 11

## 2022-11-04 ENCOUNTER — Other Ambulatory Visit: Payer: Self-pay

## 2022-11-04 ENCOUNTER — Ambulatory Visit
Admission: RE | Admit: 2022-11-04 | Discharge: 2022-11-04 | Disposition: A | Discharge status: Home / Self Care | Payer: Medicare Other | Source: Ambulatory Visit | Attending: Radiation Oncology | Admitting: Radiation Oncology

## 2022-11-04 DIAGNOSIS — Z510 Encounter for antineoplastic radiation therapy: Secondary | ICD-10-CM | POA: Diagnosis not present

## 2022-11-04 LAB — RAD ONC ARIA SESSION SUMMARY
Course Elapsed Days: 15
Plan Fractions Treated to Date: 12
Plan Prescribed Dose Per Fraction: 2.5 Gy
Plan Total Fractions Prescribed: 20
Plan Total Prescribed Dose: 50 Gy
Reference Point Dosage Given to Date: 30 Gy
Reference Point Session Dosage Given: 2.5 Gy
Session Number: 12

## 2022-11-05 ENCOUNTER — Other Ambulatory Visit: Payer: Self-pay

## 2022-11-05 ENCOUNTER — Ambulatory Visit
Admission: RE | Admit: 2022-11-05 | Discharge: 2022-11-05 | Disposition: A | Discharge status: Home / Self Care | Payer: Medicare Other | Source: Ambulatory Visit | Attending: Radiation Oncology | Admitting: Radiation Oncology

## 2022-11-05 DIAGNOSIS — Z510 Encounter for antineoplastic radiation therapy: Secondary | ICD-10-CM | POA: Diagnosis not present

## 2022-11-05 LAB — RAD ONC ARIA SESSION SUMMARY
Course Elapsed Days: 16
Plan Fractions Treated to Date: 13
Plan Prescribed Dose Per Fraction: 2.5 Gy
Plan Total Fractions Prescribed: 20
Plan Total Prescribed Dose: 50 Gy
Reference Point Dosage Given to Date: 32.5 Gy
Reference Point Session Dosage Given: 2.5 Gy
Session Number: 13

## 2022-11-06 ENCOUNTER — Ambulatory Visit
Admission: RE | Admit: 2022-11-06 | Discharge: 2022-11-06 | Disposition: A | Discharge status: Home / Self Care | Payer: Medicare Other | Source: Ambulatory Visit | Attending: Radiation Oncology | Admitting: Radiation Oncology

## 2022-11-06 ENCOUNTER — Other Ambulatory Visit: Payer: Self-pay

## 2022-11-06 DIAGNOSIS — Z510 Encounter for antineoplastic radiation therapy: Secondary | ICD-10-CM | POA: Diagnosis not present

## 2022-11-06 LAB — RAD ONC ARIA SESSION SUMMARY
Course Elapsed Days: 17
Plan Fractions Treated to Date: 14
Plan Prescribed Dose Per Fraction: 2.5 Gy
Plan Total Fractions Prescribed: 20
Plan Total Prescribed Dose: 50 Gy
Reference Point Dosage Given to Date: 35 Gy
Reference Point Session Dosage Given: 2.5 Gy
Session Number: 14

## 2022-11-07 ENCOUNTER — Other Ambulatory Visit: Payer: Self-pay

## 2022-11-07 ENCOUNTER — Ambulatory Visit
Admission: RE | Admit: 2022-11-07 | Discharge: 2022-11-07 | Disposition: A | Discharge status: Home / Self Care | Payer: Medicare Other | Source: Ambulatory Visit | Attending: Radiation Oncology | Admitting: Radiation Oncology

## 2022-11-07 DIAGNOSIS — Z510 Encounter for antineoplastic radiation therapy: Secondary | ICD-10-CM | POA: Diagnosis not present

## 2022-11-07 LAB — RAD ONC ARIA SESSION SUMMARY
Course Elapsed Days: 18
Plan Fractions Treated to Date: 15
Plan Prescribed Dose Per Fraction: 2.5 Gy
Plan Total Fractions Prescribed: 20
Plan Total Prescribed Dose: 50 Gy
Reference Point Dosage Given to Date: 37.5 Gy
Reference Point Session Dosage Given: 2.5 Gy
Session Number: 15

## 2022-11-10 ENCOUNTER — Other Ambulatory Visit: Payer: Self-pay

## 2022-11-10 ENCOUNTER — Ambulatory Visit: Payer: Medicare Other

## 2022-11-10 ENCOUNTER — Ambulatory Visit
Admission: RE | Admit: 2022-11-10 | Discharge: 2022-11-10 | Disposition: A | Discharge status: Home / Self Care | Payer: Medicare Other | Source: Ambulatory Visit | Attending: Radiation Oncology | Admitting: Radiation Oncology

## 2022-11-10 DIAGNOSIS — Z510 Encounter for antineoplastic radiation therapy: Secondary | ICD-10-CM | POA: Diagnosis not present

## 2022-11-10 LAB — RAD ONC ARIA SESSION SUMMARY
Course Elapsed Days: 21
Plan Fractions Treated to Date: 16
Plan Prescribed Dose Per Fraction: 2.5 Gy
Plan Total Fractions Prescribed: 20
Plan Total Prescribed Dose: 50 Gy
Reference Point Dosage Given to Date: 40 Gy
Reference Point Session Dosage Given: 2.5 Gy
Session Number: 16

## 2022-11-11 ENCOUNTER — Ambulatory Visit
Admission: RE | Admit: 2022-11-11 | Discharge: 2022-11-11 | Disposition: A | Discharge status: Home / Self Care | Payer: Medicare Other | Source: Ambulatory Visit | Attending: Radiation Oncology | Admitting: Radiation Oncology

## 2022-11-11 ENCOUNTER — Other Ambulatory Visit: Payer: Self-pay

## 2022-11-11 DIAGNOSIS — Z510 Encounter for antineoplastic radiation therapy: Secondary | ICD-10-CM | POA: Diagnosis not present

## 2022-11-11 LAB — RAD ONC ARIA SESSION SUMMARY
Course Elapsed Days: 22
Plan Fractions Treated to Date: 17
Plan Prescribed Dose Per Fraction: 2.5 Gy
Plan Total Fractions Prescribed: 20
Plan Total Prescribed Dose: 50 Gy
Reference Point Dosage Given to Date: 42.5 Gy
Reference Point Session Dosage Given: 2.5 Gy
Session Number: 17

## 2022-11-12 ENCOUNTER — Other Ambulatory Visit: Payer: Self-pay

## 2022-11-12 ENCOUNTER — Ambulatory Visit
Admission: RE | Admit: 2022-11-12 | Discharge: 2022-11-12 | Disposition: A | Discharge status: Home / Self Care | Payer: Medicare Other | Source: Ambulatory Visit | Attending: Radiation Oncology | Admitting: Radiation Oncology

## 2022-11-12 DIAGNOSIS — Z510 Encounter for antineoplastic radiation therapy: Secondary | ICD-10-CM | POA: Diagnosis not present

## 2022-11-12 LAB — RAD ONC ARIA SESSION SUMMARY
Course Elapsed Days: 23
Plan Fractions Treated to Date: 18
Plan Prescribed Dose Per Fraction: 2.5 Gy
Plan Total Fractions Prescribed: 20
Plan Total Prescribed Dose: 50 Gy
Reference Point Dosage Given to Date: 45 Gy
Reference Point Session Dosage Given: 2.5 Gy
Session Number: 18

## 2022-11-13 ENCOUNTER — Other Ambulatory Visit: Payer: Self-pay

## 2022-11-13 ENCOUNTER — Ambulatory Visit
Admission: RE | Admit: 2022-11-13 | Discharge: 2022-11-13 | Disposition: A | Discharge status: Home / Self Care | Payer: Medicare Other | Source: Ambulatory Visit | Attending: Radiation Oncology | Admitting: Radiation Oncology

## 2022-11-13 DIAGNOSIS — Z510 Encounter for antineoplastic radiation therapy: Secondary | ICD-10-CM | POA: Diagnosis not present

## 2022-11-13 LAB — RAD ONC ARIA SESSION SUMMARY
Course Elapsed Days: 24
Plan Fractions Treated to Date: 19
Plan Prescribed Dose Per Fraction: 2.5 Gy
Plan Total Fractions Prescribed: 20
Plan Total Prescribed Dose: 50 Gy
Reference Point Dosage Given to Date: 47.5 Gy
Reference Point Session Dosage Given: 2.5 Gy
Session Number: 19

## 2022-11-14 ENCOUNTER — Encounter: Payer: Self-pay | Admitting: Radiation Oncology

## 2022-11-14 ENCOUNTER — Ambulatory Visit
Admission: RE | Admit: 2022-11-14 | Discharge: 2022-11-14 | Disposition: A | Discharge status: Home / Self Care | Payer: Medicare Other | Source: Ambulatory Visit | Attending: Radiation Oncology | Admitting: Radiation Oncology

## 2022-11-14 ENCOUNTER — Other Ambulatory Visit: Payer: Self-pay

## 2022-11-14 DIAGNOSIS — Z510 Encounter for antineoplastic radiation therapy: Secondary | ICD-10-CM | POA: Diagnosis not present

## 2022-11-14 LAB — RAD ONC ARIA SESSION SUMMARY
Course Elapsed Days: 25
Plan Fractions Treated to Date: 20
Plan Prescribed Dose Per Fraction: 2.5 Gy
Plan Total Fractions Prescribed: 20
Plan Total Prescribed Dose: 50 Gy
Reference Point Dosage Given to Date: 50 Gy
Reference Point Session Dosage Given: 2.5 Gy
Session Number: 20

## 2022-11-26 ENCOUNTER — Encounter: Payer: Self-pay | Admitting: Obstetrics and Gynecology

## 2022-12-09 NOTE — Progress Notes (Signed)
Tammy Bowen presents following completion of radiation treatment to her nose. She completed radiation therapy on 11-14-22.   Pain issues, if any: none to report Using a feeding tube?: no, never had Weight changes, if any: none Wt Readings from Last 3 Encounters:  12/17/22 107 lb 6 oz (48.7 kg)  09/30/22 108 lb 4 oz (49.1 kg)  01/10/22 109 lb (49.4 kg)    Swallowing issues, if any: no issues with swallowing Smoking or chewing tobacco? none Using fluoride trays daily? none Last ENT visit was on: no follow for dermatology since dx Other notable issues, if any: electron verus proton treatment, when to follow up with dermatology, healing timeline and treatment  Vitals:   12/17/22 1137  BP: (!) 147/65  Pulse: 64  Resp: 18  Temp: (!) 96.5 F (35.8 C)  SpO2: 99%

## 2022-12-10 NOTE — Progress Notes (Signed)
                                                                                                                                                             Patient Name: Tammy Bowen MRN: [id number] DOB: Nov 08, 1946 Referring Physician: Griselda Miner (Profile Not Attached) Date of Service: 11/14/2022 [facility], [place]                                                        End Of Treatment Note  Diagnoses: C44.311-Basal cell carcinoma of skin of nose  Cancer Staging:  Cancer Staging  Basal cell carcinoma of nose Staging form: Cutaneous Carcinoma of the Head and Neck, AJCC 8th Edition - Clinical stage from 09/30/2022: Stage I (cT1, cN0, cM0) - Signed by Eppie Gibson, MD on 09/30/2022 Stage prefix: Initial diagnosis Extraosseous extension: Absent  Intent: Curative  Radiation Treatment Dates: 10/20/2022 through 11/14/2022 Site Technique Total Dose (Gy) Dose per Fx (Gy) Completed Fx Beam Energies  Nose: HN_nose specialPort 50/50 2.5 20/20 6E   Narrative: The patient tolerated radiation therapy relatively well.   Plan: The patient will follow-up with radiation oncology in 29mo.  -----------------------------------  SEppie Gibson MD

## 2022-12-17 ENCOUNTER — Ambulatory Visit
Admission: RE | Admit: 2022-12-17 | Discharge: 2022-12-17 | Disposition: A | Discharge status: Home / Self Care | Payer: Medicare Other | Source: Ambulatory Visit | Attending: Radiation Oncology | Admitting: Radiation Oncology

## 2022-12-17 ENCOUNTER — Encounter: Payer: Self-pay | Admitting: Radiation Oncology

## 2022-12-17 VITALS — BP 147/65 | HR 64 | Temp 96.5°F | Resp 18 | Ht 62.0 in | Wt 107.4 lb

## 2022-12-17 DIAGNOSIS — Z79899 Other long term (current) drug therapy: Secondary | ICD-10-CM | POA: Diagnosis not present

## 2022-12-17 DIAGNOSIS — Z923 Personal history of irradiation: Secondary | ICD-10-CM | POA: Insufficient documentation

## 2022-12-17 DIAGNOSIS — C44311 Basal cell carcinoma of skin of nose: Secondary | ICD-10-CM | POA: Diagnosis present

## 2022-12-19 ENCOUNTER — Encounter: Payer: Self-pay | Admitting: Radiation Oncology

## 2022-12-19 NOTE — Progress Notes (Signed)
Radiation Oncology         [phone] ________________________________  Name: Tammy Bowen MRN: [id number]  Date: 12/17/2022  DOB: [birth year]  Follow-Up Visit Note  Outpatient  CC: Christain Sacramento, MD  Griselda Miner, MD  Diagnosis and Prior Radiotherapy:    ICD-10-CM   1. Basal cell carcinoma of nose  C44.311      Cancer Staging  Basal cell carcinoma of nose Staging form: Cutaneous Carcinoma of the Head and Neck, AJCC 8th Edition - Clinical stage from 09/30/2022: Stage I (cT1, cN0, cM0) - Signed by Eppie Gibson, MD on 09/30/2022 Stage prefix: Initial diagnosis Extraosseous extension: Absent   CHIEF COMPLAINT: Here for follow-up and surveillance of nose cancer  Narrative:  The patient returns today for routine follow-up.   She is doing well.  Her nose did get a lot of moist desquamation about 2 weeks after treatment but it has healed well.  She still has a little bit of mucosal irritation in her nostrils, particularly on the left, but steadily improving day by day    ALLERGIES:  is allergic to thimerosal (thiomersal).  Meds: Current Outpatient Medications  Medication Sig Dispense Refill   atenolol (TENORMIN) 50 MG tablet Take 50 mg by mouth daily.     B Complex Vitamins (VITAMIN B COMPLEX) TABS Take 1 tablet by mouth daily.     CALCIUM PO Take 800 mg by mouth daily.      Cholecalciferol (VITAMIN D PO) Take 2,000 Units by mouth daily.     estradiol (ESTRACE) 0.1 MG/GM vaginal cream Use 1/2 g vaginally two or three times per week 42.5 g 1   hydrochlorothiazide (MICROZIDE) 12.5 MG capsule Take 12.5 mg by mouth daily.     ibuprofen (ADVIL,MOTRIN) 200 MG tablet Take 1 tablet by mouth as needed.     losartan (COZAAR) 50 MG tablet Take 1 tablet by mouth daily.     loteprednol (LOTEMAX) 0.5 % ophthalmic suspension as needed.     Difluprednate 0.05 % EMUL Place 1 drop into both eyes daily.     No current facility-administered medications for this encounter.     Physical Findings: The patient is in no acute distress. Patient is alert and oriented.  height is '5\' 2"'$  (1.575 m) and weight is 107 lb 6 oz (48.7 kg). Her temporal temperature is 96.5 F (35.8 C) (abnormal). Her blood pressure is 147/65 (abnormal) and her pulse is 64. Her respiration is 18 and oxygen saturation is 99%. .    The skin over her nose has healed beautifully.   Nose skin is intact without any significant hyperpigmentation or erythema and without evidence of residual disease.  The pigment of the skin of her nose has not completely normalized but it looks great; her nostrils have mild erythema with scant bleeding on the left  Lab Findings: Lab Results  Component Value Date   WBC 4.5 05/10/2014   HGB 12.1 05/10/2014   HCT 37.5 05/10/2014   MCV 88.7 05/10/2014   PLT 265 05/10/2014    Radiographic Findings: No results found.  Impression/Plan: She has healed nicely from radiation therapy.   She will resume follow-up with dermatology to monitor her nose and for full skin exams.  I will see her back as needed.  She is pleased with this plan.  On date of service, in total, I spent 20 minutes on this encounter. Patient was seen in person.  The note was signed after the date of encounter- minutes pertain to  date of encounter only _____________________________________   Eppie Gibson, MD Out

## 2023-01-01 NOTE — Progress Notes (Signed)
77 y.o. G23P0011 Married Caucasian female here for breast and pelvic. Pt struggles to take pessary out. Pt took it out two days and put it back in, but it is incredibly uncomfortable to remove.   The pessary is not painful while it is in place.  She has a third degree cystocele, first to second degree vault prolapse, first degree rectocele.    Using vaginal estrogen cream.  Using three times a week.   Had radiation therapy on her nose for a basal cell CA.    PCP:   [name], MD  Patient's last menstrual period was 12/30/1983.           Sexually active: No.  The current method of family planning is status post hysterectomy/surgical.    Exercising: Yes.     walking Smoker:  former  Health Maintenance: Pap:  years ago History of abnormal Pap:  no MMG:  11/25/22 - BI-RADS1 - [facility] Colonoscopy:  Pt think she had one Jan 2023, 10/11/19 cologuard, 12/29/06 cologuard, Pt.thinks Neg.  GI is Dr. Benson Norway.  BMD:   11/26/21  Result  osteoporosis TDaP:  2014 Gardasil:   no HIV: donated blood Hep C: donated blood Screening Labs:  PCP   reports that she quit smoking about 49 years ago. Her smoking use included cigars. She has never used smokeless tobacco. She reports current alcohol use of about 7.0 standard drinks of alcohol per week. She reports that she does not use drugs.  Past Medical History:  Diagnosis Date   Cystocele 06/15/00   Hypertension    MVP (mitral valve prolapse)    Osteoporosis    bilateral hips   Thyroid nodule 05/06/16   right and left thyroid nodule     Past Surgical History:  Procedure Laterality Date   back fusion  2004, 1990   lumbar   BREAST BIOPSY Left    TUBAL LIGATION     BTSP   VAGINAL HYSTERECTOMY  1980's   Prolapsa    Current Outpatient Medications  Medication Sig Dispense Refill   atenolol (TENORMIN) 25 MG tablet Take 1 tablet (25 mg total) by mouth daily. 90 tablet 0   B Complex Vitamins (VITAMIN B COMPLEX) TABS Take 1 tablet by mouth daily.      CALCIUM PO Take 800 mg by mouth daily.      Cholecalciferol (VITAMIN D PO) Take 2,000 Units by mouth daily.     estradiol (ESTRACE) 0.1 MG/GM vaginal cream Use 1/2 g vaginally two or three times per week 42.5 g 1   ibuprofen (ADVIL,MOTRIN) 200 MG tablet Take 1 tablet by mouth as needed.     losartan (COZAAR) 50 MG tablet Take 1 tablet by mouth daily.     loteprednol (LOTEMAX) 0.5 % ophthalmic suspension as needed.     No current facility-administered medications for this visit.    Family History  Problem Relation Age of Onset   Stroke Mother    Thyroid disease Sister    Hypertension Maternal Aunt    Breast cancer Niece 73   Breast cancer Niece     Review of Systems  All other systems reviewed and are negative.   Exam:   BP 118/74 (BP Location: Right Arm, Patient Position: Sitting, Cuff Size: Normal)   Pulse (!) 52   Ht '5\' 2"'$  (1.575 m)   Wt 106 lb (48.1 kg)   LMP 12/30/1983   SpO2 100%   BMI 19.39 kg/m     General appearance: alert, cooperative and  appears stated age Head: normocephalic, without obvious abnormality, atraumatic Neck: no adenopathy, supple, symmetrical, trachea midline and thyroid normal to inspection and palpation Lungs: clear to auscultation bilaterally Breasts: normal appearance, no masses or tenderness, No nipple retraction or dimpling, No nipple discharge or bleeding, No axillary adenopathy Heart: regular rate and rhythm Abdomen: soft, non-tender; no masses, no organomegaly Extremities: extremities normal, atraumatic, no cyanosis or edema Skin: skin color, texture, turgor normal. No rashes or lesions Lymph nodes: cervical, supraclavicular, and axillary nodes normal. Neurologic: grossly normal  Pelvic: External genitalia:  no lesions              No abnormal inguinal nodes palpated.              Urethra:  normal appearing urethra with no masses, tenderness or lesions              Bartholins and Skenes: normal                 Vagina: normal appearing  vagina with normal color and discharge, no lesions.  Shallow ulceration of posterior vaginal apex.                Cervix: absent              Pap taken: no Bimanual Exam:  Uterus:  absent              Adnexa: no mass, fullness, tenderness              Rectal exam: yes.  Confirms.              Anus:  normal sphincter tone, no lesions  Pessary incontinence dish removed, cleansed, and replaced.   Chaperone was present for exam:  Emily  Assessment:   Status post hysterectomy.   Still has her ovaries.  Vaginal prolapse.  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  Pessary maintenance.  Inflammation from pessary use.  Encounter for maintenance of medication.  Osteoporosis.  Observational management. FH of breast cancer in nieces.  Plan: Mammogram screening discussed. Self breast awareness reviewed. Pap not indicated.  Guidelines for Calcium, Vitamin D, regular exercise program including cardiovascular and weight bearing exercise. Refill vaginal estrogen.  I recommend use three times a week to introitus and inside the vaginal canal.  I encouraged use of [place] Jelly for placement and removal instead of soap and water.  She declines to leave the pessary out for a period of time.  If continues with discomfort with placement and removal of pessary, will consider fitting for smaller pessary. BMD at [facility] in November at time of mammogram.  Follow up annually and prn.   After visit summary provided.   34 min  total time was spent for this patient encounter, including preparation, face-to-face counseling with the patient, coordination of care, and documentation of the encounter.

## 2023-01-12 ENCOUNTER — Encounter: Payer: Self-pay | Admitting: Internal Medicine

## 2023-01-12 ENCOUNTER — Ambulatory Visit (INDEPENDENT_AMBULATORY_CARE_PROVIDER_SITE_OTHER): Payer: Medicare Other | Admitting: Internal Medicine

## 2023-01-12 VITALS — BP 136/74 | HR 58 | Temp 98.5°F | Resp 16 | Ht 62.0 in | Wt 106.0 lb

## 2023-01-12 DIAGNOSIS — R011 Cardiac murmur, unspecified: Secondary | ICD-10-CM

## 2023-01-12 DIAGNOSIS — I10 Essential (primary) hypertension: Secondary | ICD-10-CM | POA: Diagnosis not present

## 2023-01-12 DIAGNOSIS — R001 Bradycardia, unspecified: Secondary | ICD-10-CM

## 2023-01-12 DIAGNOSIS — N1832 Chronic kidney disease, stage 3b: Secondary | ICD-10-CM | POA: Diagnosis not present

## 2023-01-12 LAB — BASIC METABOLIC PANEL
BUN: 28 mg/dL — ABNORMAL HIGH (ref 6–23)
CO2: 30 mEq/L (ref 19–32)
Calcium: 9.6 mg/dL (ref 8.4–10.5)
Chloride: 101 mEq/L (ref 96–112)
Creatinine, Ser: 0.82 mg/dL (ref 0.40–1.20)
GFR: 69.58 mL/min (ref >60.00)
Glucose, Bld: 100 mg/dL — ABNORMAL HIGH (ref 70–99)
Potassium: 4 mEq/L (ref 3.5–5.1)
Sodium: 137 mEq/L (ref 135–145)

## 2023-01-12 LAB — URINALYSIS, ROUTINE W REFLEX MICROSCOPIC
Bilirubin Urine: NEGATIVE
Ketones, ur: NEGATIVE
Nitrite: NEGATIVE
Specific Gravity, Urine: 1.015 (ref 1.000–1.030)
Total Protein, Urine: NEGATIVE
Urine Glucose: NEGATIVE
Urobilinogen, UA: 0.2 (ref 0.0–1.0)
pH: 8 (ref 5.0–8.0)

## 2023-01-12 LAB — TSH: TSH: 3.78 u[IU]/mL (ref 0.35–5.50)

## 2023-01-12 MED ORDER — ATENOLOL 25 MG PO TABS: 25.0000 mg | ORAL_TABLET | Freq: Every day | ORAL | 3 refills | Status: DC

## 2023-01-12 MED ORDER — ATENOLOL 25 MG PO TABS: 25.0000 mg | ORAL_TABLET | Freq: Every day | ORAL | 0 refills | Status: DC

## 2023-01-12 NOTE — Progress Notes (Signed)
Subjective:  Patient ID: Tammy Bowen, female    DOB: [birth year]  Age: 77 y.o. MRN: [id number]  CC: Hypertension   HPI Tammy Bowen presents for establishing -  She has rare episodes of dizziness.  She tells me her SBP can be as high as 170 and as low as 104.  She is active and denies chest pain, shortness of breath, palpitations, near-syncope, or paresthesias.  Outpatient Medications Prior to Visit  Medication Sig Dispense Refill   B Complex Vitamins (VITAMIN B COMPLEX) TABS Take 1 tablet by mouth daily.     CALCIUM PO Take 800 mg by mouth daily.      Cholecalciferol (VITAMIN D PO) Take 2,000 Units by mouth daily.     estradiol (ESTRACE) 0.1 MG/GM vaginal cream Use 1/2 g vaginally two or three times per week 42.5 g 1   ibuprofen (ADVIL,MOTRIN) 200 MG tablet Take 1 tablet by mouth as needed.     losartan (COZAAR) 50 MG tablet Take 1 tablet by mouth daily.     loteprednol (LOTEMAX) 0.5 % ophthalmic suspension as needed.     atenolol (TENORMIN) 50 MG tablet Take 50 mg by mouth daily.     hydrochlorothiazide (MICROZIDE) 12.5 MG capsule Take 12.5 mg by mouth daily.     Difluprednate 0.05 % EMUL Place 1 drop into both eyes daily.     No facility-administered medications prior to visit.    ROS Review of Systems  Constitutional: Negative.  Negative for chills, diaphoresis, fatigue and fever.  HENT: Negative.    Eyes: Negative.   Respiratory:  Negative for cough, chest tightness, shortness of breath and wheezing.   Cardiovascular:  Negative for chest pain, palpitations and leg swelling.  Gastrointestinal:  Negative for abdominal pain, constipation, diarrhea, nausea and vomiting.  Endocrine: Negative.   Genitourinary: Negative.  Negative for difficulty urinating.  Musculoskeletal: Negative.   Skin: Negative.   Neurological:  Positive for dizziness. Negative for weakness and light-headedness.  Hematological:  Negative for adenopathy. Does not bruise/bleed easily.   Psychiatric/Behavioral: Negative.      Objective:  BP 136/74 (BP Location: Left Arm, Patient Position: Sitting, Cuff Size: Normal)   Pulse (!) 58   Temp 98.5 F (36.9 C) (Oral)   Resp 16   Ht '5\' 2"'$  (1.575 m)   Wt 106 lb (48.1 kg)   LMP 12/30/1983   SpO2 98%   BMI 19.39 kg/m   BP Readings from Last 3 Encounters:  01/12/23 136/74  12/17/22 (!) 147/65  09/30/22 (!) 142/61    Wt Readings from Last 3 Encounters:  01/12/23 106 lb (48.1 kg)  12/17/22 107 lb 6 oz (48.7 kg)  09/30/22 108 lb 4 oz (49.1 kg)    Physical Exam Vitals reviewed.  Constitutional:      Appearance: She is not ill-appearing.  HENT:     Mouth/Throat:     Mouth: Mucous membranes are moist.  Eyes:     General: No scleral icterus.    Conjunctiva/sclera: Conjunctivae normal.  Cardiovascular:     Rate and Rhythm: Regular rhythm. Bradycardia present.     Heart sounds: S1 normal and S2 normal. Murmur heard.     Systolic murmur is present with a grade of 1/6.     No diastolic murmur is present.     No friction rub. No gallop.     Comments: 1/6 SEM LLSB  EKG- SB, 57 bpm ?LAE No LVH, Q waves, or ST/T wave changes Pulmonary:  Effort: Pulmonary effort is normal.     Breath sounds: No stridor. No wheezing, rhonchi or rales.  Abdominal:     General: Abdomen is flat.     Palpations: There is no mass.     Tenderness: There is no abdominal tenderness. There is no guarding.     Hernia: No hernia is present.  Musculoskeletal:        General: Normal range of motion.     Cervical back: No tenderness.     Right lower leg: No edema.     Left lower leg: No edema.  Lymphadenopathy:     Cervical: No cervical adenopathy.  Skin:    General: Skin is warm and dry.  Neurological:     General: No focal deficit present.     Mental Status: She is alert.  Psychiatric:        Mood and Affect: Mood normal.        Behavior: Behavior normal.     Lab Results  Component Value Date   WBC 4.5 05/10/2014   HGB  12.1 05/10/2014   HCT 37.5 05/10/2014   PLT 265 05/10/2014   GLUCOSE 100 (H) 01/12/2023   CHOL 200 05/10/2014   TRIG 124 05/10/2014   HDL 75 05/10/2014   LDLCALC 100 (H) 05/10/2014   ALT 13 05/10/2014   AST 21 05/10/2014   NA 137 01/12/2023   K 4.0 01/12/2023   CL 101 01/12/2023   CREATININE 0.82 01/12/2023   BUN 28 (H) 01/12/2023   CO2 30 01/12/2023   TSH 3.78 01/12/2023    No results found.  Assessment & Plan:   Tammy Bowen was seen today for hypertension.  Diagnoses and all orders for this visit:  Grade 1 out of 6 intensity murmur -     ECHOCARDIOGRAM COMPLETE; Future  Primary hypertension- Her blood pressure is well-controlled but she has renal insufficiency.  Will discontinue the thiazide diuretic and taper off of atenolol.  Will continue the ARB. -     Urinalysis, Routine w reflex microscopic; Future -     TSH; Future -     Basic metabolic panel; Future -     EKG 12-Lead -     Discontinue: atenolol (TENORMIN) 25 MG tablet; Take 1 tablet (25 mg total) by mouth daily. -     atenolol (TENORMIN) 25 MG tablet; Take 1 tablet (25 mg total) by mouth daily. -     Basic metabolic panel -     TSH -     Urinalysis, Routine w reflex microscopic  Bradycardia on ECG- She is symptomatic from this.  Will taper her off of atenolol.  Stage 3b chronic kidney disease ([name])- She is avoiding nephrotoxic agents.   I have discontinued Tammy Bowen "Tammy Bowen"'s atenolol, hydrochlorothiazide, Difluprednate, and atenolol. I am also having her start on atenolol. Additionally, I am having her maintain her CALCIUM PO, Cholecalciferol (VITAMIN D PO), ibuprofen, loteprednol, Vitamin B Complex, estradiol, and losartan.  Meds ordered this encounter  Medications   DISCONTD: atenolol (TENORMIN) 25 MG tablet    Sig: Take 1 tablet (25 mg total) by mouth daily.    Dispense:  90 tablet    Refill:  3   atenolol (TENORMIN) 25 MG tablet    Sig: Take 1 tablet (25 mg total) by mouth daily.     Dispense:  90 tablet    Refill:  0     Follow-up: Return in about 3 months (around 04/13/2023).  [name], MD

## 2023-01-12 NOTE — Patient Instructions (Signed)
Bradycardia, Adult Bradycardia is a slower-than-normal heartbeat. A normal resting heart rate for an adult ranges from 60 to 100 beats per minute. With bradycardia, the resting heart rate is less than 60 beats per minute. Bradycardia can prevent enough oxygen from reaching certain areas of your body when you are active. It can be serious if it keeps enough oxygen from reaching your brain and other parts of your body. Bradycardia is not a problem for everyone. For some healthy adults, a slow resting heart rate is normal. What are the causes? This condition may be caused by: A problem with the heart, including: A problem with the heart's electrical system, such as a heart block. With a heart block, electrical signals between the chambers of the heart are partially or completely blocked, so they are not able to work as they should. A problem with the heart's natural pacemaker (sinus node). Heart disease. A heart attack. Heart damage. Lyme disease. A heart infection. A heart condition that is present at birth (congenital heart defect). Certain medicines that treat heart conditions. Certain conditions, such as hypothyroidism and obstructive sleep apnea. Problems with the balance of chemicals and other substances, like potassium, in the blood. Trauma. Radiation therapy. What increases the risk? You are more likely to develop this condition if you: Are age 65 or older. Have high blood pressure (hypertension), high cholesterol (hyperlipidemia), or diabetes. Drink heavily, use tobacco or nicotine products, or use drugs. What are the signs or symptoms? Symptoms of this condition include: Light-headedness. Feeling faint or fainting. Fatigue and weakness. Trouble with activity or exercise. Shortness of breath. Chest pain (angina). Drowsiness. Confusion. Dizziness. How is this diagnosed? This condition may be diagnosed based on: Your symptoms. Your medical history. A physical exam. During  the exam, your health care provider will listen to your heartbeat and check your pulse. To confirm the diagnosis, your health care provider may order tests, such as: Blood tests. An electrocardiogram (ECG). This test records the heart's electrical activity. The test can show how fast your heart is beating and whether the heartbeat is steady. A test in which you wear a portable device (event recorder or Holter monitor) to record your heart's electrical activity while you go about your day. An exercise test. How is this treated? Treatment for this condition depends on the cause of the condition and how severe your symptoms are. Treatment may involve: Treatment of the underlying condition. Changing your medicines or how much medicine you take. Having a small, battery-operated device called a pacemaker implanted under the skin. When bradycardia occurs, this device can be used to increase your heart rate and help your heart beat in a regular rhythm. Follow these instructions at home: Lifestyle Manage any health conditions that contribute to bradycardia as told by your health care provider. Follow a heart-healthy diet. A nutrition specialist (dietitian) can help educate you about healthy food options and changes. Follow an exercise program that is approved by your health care provider. Maintain a healthy weight. Try to reduce or manage your stress, such as with yoga or meditation. If you need help reducing stress, ask your health care provider. Do not use any products that contain nicotine or tobacco. These products include cigarettes, chewing tobacco, and vaping devices, such as e-cigarettes. If you need help quitting, ask your health care provider. Do not use illegal drugs. Alcohol use If you drink alcohol: Limit how much you have to: 0-1 drink a day for women who are not pregnant. 0-2 drinks a day   for men. Know how much alcohol is in a drink. In the [place] one drink equals one 12 oz bottle of  beer (355 mL), one 5 oz glass of wine (148 mL), or one 1 oz glass of hard liquor (44 mL). General instructions Take over-the-counter and prescription medicines only as told by your health care provider. Keep all follow-up visits. This is important. How is this prevented? In some cases, bradycardia may be prevented by: Treating underlying medical problems. Stopping behaviors or medicines that can trigger the condition. Contact a health care provider if: You feel light-headed or dizzy. You almost faint. You feel weak or are easily fatigued during physical activity. You experience confusion or have memory problems. Get help right away if: You faint. You have chest pains or an irregular heartbeat (palpitations). You have trouble breathing. These symptoms may represent a serious problem that is an emergency. Do not wait to see if the symptoms will go away. Get medical help right away. Call your local emergency services (911 in the [place] Do not drive yourself to the hospital. Summary Bradycardia is a slower-than-normal heartbeat. With bradycardia, the resting heart rate is less than 60 beats per minute. Treatment for this condition depends on the cause. Manage any health conditions that contribute to bradycardia as told by your health care provider. Do not use any products that contain nicotine or tobacco. These products include cigarettes, chewing tobacco, and vaping devices, such as e-cigarettes. Keep all follow-up visits. This is important. This information is not intended to replace advice given to you by your health care provider. Make sure you discuss any questions you have with your health care provider. Document Revised: 04/07/2021 Document Reviewed: 04/07/2021 Elsevier Patient Education  2023 [employer]  

## 2023-01-14 ENCOUNTER — Encounter: Payer: Self-pay | Admitting: Obstetrics and Gynecology

## 2023-01-14 ENCOUNTER — Ambulatory Visit (INDEPENDENT_AMBULATORY_CARE_PROVIDER_SITE_OTHER): Payer: Medicare Other | Admitting: Obstetrics and Gynecology

## 2023-01-14 VITALS — BP 118/74 | HR 52 | Ht 62.0 in | Wt 106.0 lb

## 2023-01-14 DIAGNOSIS — M810 Age-related osteoporosis without current pathological fracture: Secondary | ICD-10-CM

## 2023-01-14 DIAGNOSIS — N760 Acute vaginitis: Secondary | ICD-10-CM

## 2023-01-14 DIAGNOSIS — N819 Female genital prolapse, unspecified: Secondary | ICD-10-CM

## 2023-01-14 DIAGNOSIS — Z4689 Encounter for fitting and adjustment of other specified devices: Secondary | ICD-10-CM | POA: Diagnosis not present

## 2023-01-14 DIAGNOSIS — T8369XD Infection and inflammatory reaction due to other prosthetic device, implant and graft in genital tract, subsequent encounter: Secondary | ICD-10-CM | POA: Diagnosis not present

## 2023-01-14 DIAGNOSIS — Z5181 Encounter for therapeutic drug level monitoring: Secondary | ICD-10-CM

## 2023-01-14 MED ORDER — ESTRADIOL 0.1 MG/GM VA CREA: TOPICAL_CREAM | VAGINAL | 2 refills | Status: DC

## 2023-02-05 ENCOUNTER — Ambulatory Visit (INDEPENDENT_AMBULATORY_CARE_PROVIDER_SITE_OTHER): Payer: Medicare Other

## 2023-02-05 DIAGNOSIS — R011 Cardiac murmur, unspecified: Secondary | ICD-10-CM

## 2023-02-05 LAB — ECHOCARDIOGRAM COMPLETE
Area-P 1/2: 3.37 cm2
S' Lateral: 1.7 cm

## 2023-02-06 ENCOUNTER — Other Ambulatory Visit: Payer: Self-pay | Admitting: Internal Medicine

## 2023-02-06 DIAGNOSIS — I5032 Chronic diastolic (congestive) heart failure: Secondary | ICD-10-CM

## 2023-02-06 MED ORDER — EMPAGLIFLOZIN 10 MG PO TABS: 10.0000 mg | ORAL_TABLET | Freq: Every day | ORAL | 1 refills | Status: DC

## 2023-02-24 ENCOUNTER — Other Ambulatory Visit (INDEPENDENT_AMBULATORY_CARE_PROVIDER_SITE_OTHER): Payer: Medicare Other

## 2023-02-24 ENCOUNTER — Other Ambulatory Visit: Payer: Self-pay | Admitting: Internal Medicine

## 2023-02-24 ENCOUNTER — Telehealth: Payer: Self-pay

## 2023-02-24 DIAGNOSIS — R300 Dysuria: Secondary | ICD-10-CM | POA: Diagnosis not present

## 2023-02-24 DIAGNOSIS — N3001 Acute cystitis with hematuria: Secondary | ICD-10-CM

## 2023-02-24 LAB — URINALYSIS, ROUTINE W REFLEX MICROSCOPIC
Bilirubin Urine: NEGATIVE
Ketones, ur: NEGATIVE
Nitrite: NEGATIVE
Specific Gravity, Urine: 1.015 (ref 1.000–1.030)
Total Protein, Urine: 30 — AB
Urine Glucose: NEGATIVE
Urobilinogen, UA: 0.2 (ref 0.0–1.0)
pH: 7 (ref 5.0–8.0)

## 2023-02-24 MED ORDER — NITROFURANTOIN MONOHYD MACRO 100 MG PO CAPS: 100.0000 mg | ORAL_CAPSULE | Freq: Two times a day (BID) | ORAL | 0 refills | Status: AC

## 2023-02-24 NOTE — Telephone Encounter (Signed)
Pt has stated can urine labs be ordered as she would like to come drop off a urine sample in the next hour or two as pt states she is having major UTI sxs started x10 days ago with discomfort urinating, odor and now she has seen blood in her urine this morning.   Pt has sched an apptmnt to see PCP for 02/25/2023 at 3:20 pm.

## 2023-02-24 NOTE — Telephone Encounter (Signed)
Orders have been entered and pt has already left samples for processing.

## 2023-02-24 NOTE — Addendum Note (Signed)
Addended by: Janith Lima on: 02/24/2023 04:02 PM   Modules accepted: Orders

## 2023-02-25 ENCOUNTER — Ambulatory Visit (INDEPENDENT_AMBULATORY_CARE_PROVIDER_SITE_OTHER): Payer: Medicare Other | Admitting: Internal Medicine

## 2023-02-25 VITALS — BP 120/64 | HR 75 | Temp 98.4°F | Ht 62.0 in | Wt 107.0 lb

## 2023-02-25 DIAGNOSIS — R011 Cardiac murmur, unspecified: Secondary | ICD-10-CM | POA: Diagnosis not present

## 2023-02-25 DIAGNOSIS — R001 Bradycardia, unspecified: Secondary | ICD-10-CM

## 2023-02-25 DIAGNOSIS — I5032 Chronic diastolic (congestive) heart failure: Secondary | ICD-10-CM

## 2023-02-25 DIAGNOSIS — N3001 Acute cystitis with hematuria: Secondary | ICD-10-CM

## 2023-02-25 DIAGNOSIS — I10 Essential (primary) hypertension: Secondary | ICD-10-CM

## 2023-02-25 DIAGNOSIS — N1832 Chronic kidney disease, stage 3b: Secondary | ICD-10-CM | POA: Diagnosis not present

## 2023-02-25 NOTE — Progress Notes (Signed)
Subjective:  Patient ID: Tammy Bowen, female    DOB: 01/12/1946  Age: 76 y.o. MRN: [id number]  CC: Urinary Tract Infection   HPI Tammy Bowen presents for f/up ----  She complains of a 1 week history of fever to 101, chills, and dysuria.  She started taking nitrofurantoin the night prior to this visit and her symptoms have improved.  Incidentally she had some teeth extracted on the right side this week as well.  She has not started taking Jardiance yet.  She recently underwent an echocardiogram that showed valvular heart disease and HFpEF.  She would like to see a cardiologist.  Outpatient Medications Prior to Visit  Medication Sig Dispense Refill   B Complex Vitamins (VITAMIN B COMPLEX) TABS Take 1 tablet by mouth daily.     calcium carbonate (SUPER CALCIUM) 1500 (600 Ca) MG TABS tablet Take 1 tablet by mouth daily.     CALCIUM PO Take 800 mg by mouth daily.      Cholecalciferol (VITAMIN D PO) Take 2,000 Units by mouth daily.     cholecalciferol 25 MCG (1000 UT) tablet Take 1,000 Units by mouth.     empagliflozin (JARDIANCE) 10 MG TABS tablet Take 1 tablet (10 mg total) by mouth daily before breakfast. (Patient not taking: Reported on 02/25/2023) 90 tablet 1   estradiol (ESTRACE) 0.1 MG/GM vaginal cream Use 1/2 g vaginally two or three times per week 42.5 g 2   losartan (COZAAR) 50 MG tablet Take 1 tablet by mouth daily.     loteprednol (LOTEMAX) 0.5 % ophthalmic suspension as needed.     nitrofurantoin, macrocrystal-monohydrate, (MACROBID) 100 MG capsule Take 1 capsule (100 mg total) by mouth 2 (two) times daily for 5 days. 10 capsule 0   atenolol (TENORMIN) 25 MG tablet Take 1 tablet (25 mg total) by mouth daily. 90 tablet 0   ibuprofen (ADVIL,MOTRIN) 200 MG tablet Take 1 tablet by mouth as needed.     No facility-administered medications prior to visit.    ROS Review of Systems  Constitutional:  Positive for chills and fever. Negative for fatigue.  HENT: Negative.   Negative for sore throat.   Eyes: Negative.   Respiratory:  Negative for chest tightness, shortness of breath and wheezing.   Cardiovascular:  Negative for chest pain, palpitations and leg swelling.  Gastrointestinal:  Negative for abdominal pain, diarrhea, nausea and vomiting.  Endocrine: Negative.   Genitourinary:  Positive for dysuria. Negative for decreased urine volume, difficulty urinating, flank pain, frequency and urgency.  Musculoskeletal: Negative.   Skin: Negative.   Neurological: Negative.  Negative for dizziness, weakness and light-headedness.  Hematological:  Negative for adenopathy. Does not bruise/bleed easily.  Psychiatric/Behavioral: Negative.      Objective:  BP 120/64 (BP Location: Left Arm, Patient Position: Sitting, Cuff Size: Normal)   Pulse 75   Temp 98.4 F (36.9 C) (Oral)   Ht '5\' 2"'$  (1.575 m)   Wt 107 lb (48.5 kg)   LMP 12/30/1983   SpO2 97%   BMI 19.57 kg/m   BP Readings from Last 3 Encounters:  02/25/23 120/64  01/14/23 118/74  01/12/23 136/74    Wt Readings from Last 3 Encounters:  02/25/23 107 lb (48.5 kg)  01/14/23 106 lb (48.1 kg)  01/12/23 106 lb (48.1 kg)    Physical Exam Vitals reviewed.  Constitutional:      Appearance: She is not ill-appearing.  HENT:     Nose: Nose normal.     Mouth/Throat:  Mouth: Mucous membranes are moist.  Eyes:     General: No scleral icterus.    Conjunctiva/sclera: Conjunctivae normal.  Cardiovascular:     Rate and Rhythm: Normal rate and regular rhythm.     Heart sounds: Murmur heard.     Systolic murmur is present with a grade of 1/6.     No diastolic murmur is present.     No friction rub. No gallop.  Pulmonary:     Effort: Pulmonary effort is normal.     Breath sounds: No stridor. No wheezing, rhonchi or rales.  Abdominal:     General: Abdomen is flat.     Palpations: There is no mass.     Tenderness: There is no abdominal tenderness. There is no guarding.     Hernia: No hernia is  present.  Musculoskeletal:     Cervical back: Neck supple.     Right lower leg: No edema.     Left lower leg: No edema.  Lymphadenopathy:     Cervical: No cervical adenopathy.  Skin:    General: Skin is warm and dry.  Neurological:     General: No focal deficit present.     Mental Status: She is alert.  Psychiatric:        Mood and Affect: Mood normal.        Behavior: Behavior normal.     Lab Results  Component Value Date   WBC 4.5 05/10/2014   HGB 12.1 05/10/2014   HCT 37.5 05/10/2014   PLT 265 05/10/2014   GLUCOSE 100 (H) 01/12/2023   CHOL 200 05/10/2014   TRIG 124 05/10/2014   HDL 75 05/10/2014   LDLCALC 100 (H) 05/10/2014   ALT 13 05/10/2014   AST 21 05/10/2014   NA 137 01/12/2023   K 4.0 01/12/2023   CL 101 01/12/2023   CREATININE 0.82 01/12/2023   BUN 28 (H) 01/12/2023   CO2 30 01/12/2023   TSH 3.78 01/12/2023    No results found.  Assessment & Plan:   Tammy Bowen was seen today for urinary tract infection.  Diagnoses and all orders for this visit:  Primary hypertension- Her blood pressure is adequately well-controlled.  Stage 3b chronic kidney disease ([facility])  Grade 1 out of 6 intensity murmur -     Ambulatory referral to Cardiology  Acute cystitis with hematuria- She has responded well to nitrofurantoin.  Chronic heart failure with preserved ejection fraction ([place])- She has not started Jardiance yet.   I have discontinued Tammy Bowen "[name]"'s ibuprofen and atenolol. I am also having her maintain her CALCIUM PO, Cholecalciferol (VITAMIN D PO), loteprednol, Vitamin B Complex, losartan, estradiol, empagliflozin, nitrofurantoin (macrocrystal-monohydrate), calcium carbonate, and cholecalciferol.  No orders of the defined types were placed in this encounter.    Follow-up: No follow-ups on file.  Tammy Calico, MD

## 2023-02-27 ENCOUNTER — Encounter: Payer: Self-pay | Admitting: Internal Medicine

## 2023-02-27 LAB — CULTURE, URINE COMPREHENSIVE

## 2023-03-16 NOTE — Progress Notes (Unsigned)
Cardiology Office Note:    Date:  03/18/2023   ID:  Tammy Bowen, DOB 1946-09-07, MRN [id number]  PCP:  Janith Lima, MD   [place] Providers Cardiologist:  None     Referring MD: Janith Lima, MD   Chief Complaint  Patient presents with   Hypertension   Heart Murmur    History of Present Illness:    Tammy Bowen is a 77 y.o. female seen at the request of Dr Ronnald Ramp for evaluation of a heart murmur and diastolic dysfunction. She has HTN and had been on chronic therapy with losartan, HCT and atenolol. When seen by Dr Ronnald Ramp she was bradycardic so tapered off atenolol. HCTZ was also discontinued for ?CKD. She is still on losartan. She was noted to have a murmur and sent for an Echo as noted below. Of note she is active. She denies any chest pain, palpitations, or swelling. May get a little SOB going up hills. In general she feels well. Does have a history of UTIs. Since above medication changes she has noted BP at home has been higher - typically [id number] systolic. She does report she had a stress test in [place] years ago that was normal.  She previously was a Pharmacist, hospital at [employer]. Her son is a Garment/textile technologist in [place].   Past Medical History:  Diagnosis Date   Cystocele 06/15/00   Hypertension    MVP (mitral valve prolapse)    Osteoporosis    bilateral hips   Thyroid nodule 05/06/16   right and left thyroid nodule     Past Surgical History:  Procedure Laterality Date   back fusion  2004, 1990   lumbar   BREAST BIOPSY Left    TUBAL LIGATION     BTSP   VAGINAL HYSTERECTOMY  1980's   Prolapsa    Current Medications: Current Meds  Medication Sig   B Complex Vitamins (VITAMIN B COMPLEX) TABS Take 1 tablet by mouth daily.   calcium carbonate (SUPER CALCIUM) 1500 (600 Ca) MG TABS tablet Take 1 tablet by mouth daily.   cholecalciferol 25 MCG (1000 UT) tablet Take 1,000 Units by mouth.   estradiol (ESTRACE) 0.1 MG/GM vaginal cream Use 1/2 g vaginally two or  three times per week   losartan (COZAAR) 100 MG tablet Take 1 tablet (100 mg total) by mouth daily.   loteprednol (LOTEMAX) 0.5 % ophthalmic suspension as needed.   [DISCONTINUED] losartan (COZAAR) 50 MG tablet Take 1 tablet by mouth daily.     Allergies:   Thimerosal (thiomersal)   Social History   Socioeconomic History   Marital status: Married    Spouse name: Not on file   Number of children: Not on file   Years of education: Not on file   Highest education level: Not on file  Occupational History   Not on file  Tobacco Use   Smoking status: Former    Types: Cigars    Quit date: 05/09/1973    Years since quitting: 49.8   Smokeless tobacco: Never  Vaping Use   Vaping Use: Never used  Substance and Sexual Activity   Alcohol use: Yes    Alcohol/week: 7.0 standard drinks of alcohol    Types: 7 Glasses of wine per week    Comment: a glass of wine at night, occ beer   Drug use: No   Sexual activity: Not Currently    Partners: Male    Birth control/protection: Post-menopausal, Surgical    Comment: TVH  Other Topics Concern   Not on file  Social History Narrative   Not on file   Social Determinants of Health   Financial Resource Strain: Not on file  Food Insecurity: Not on file  Transportation Needs: Not on file  Physical Activity: Not on file  Stress: Not on file  Social Connections: Not on file     Family History: The patient's family history includes Breast cancer in her niece; Breast cancer (age of onset: 16) in her niece; Hypertension in her maternal aunt; Stroke in her mother; Thyroid disease in her sister.  ROS:   Please see the history of present illness.     All other systems reviewed and are negative.  EKGs/Labs/Other Studies Reviewed:    The following studies were reviewed today: Eccho 02/05/23: IMPRESSIONS     1. Aliasing below the aortic valve in the LVOT is noted, suggestive of  elevated flow but no significant obstruction. This may explain the  murmur.  Left ventricular ejection fraction, by estimation, is 60 to 65%. Left  ventricular ejection fraction by 3D  volume is 63 %. The left ventricle has normal function. The left ventricle  has no regional wall motion abnormalities. There is mild asymmetric left  ventricular hypertrophy of the basal-septal segment. Left ventricular  diastolic parameters are consistent  with Grade I diastolic dysfunction (impaired relaxation).   2. Right ventricular systolic function is normal. The right ventricular  size is normal. There is normal pulmonary artery systolic pressure. The  estimated right ventricular systolic pressure is [id number] mmHg.   3. The mitral valve is abnormal. Trivial mitral valve regurgitation.   4. The aortic valve is tricuspid. Aortic valve regurgitation is not  visualized. Aortic valve sclerosis is present, with no evidence of aortic  valve stenosis.   5. The inferior vena cava is normal in size with greater than 50%  respiratory variability, suggesting right atrial pressure of 3 mmHg.   Comparison(s): No prior Echocardiogram.    EKG:  EKG is  ordered today.  The ekg ordered today demonstrates NSR rate 57. No acute change. I have personally reviewed and interpreted this study.   Recent Labs: 01/12/2023: BUN 28; Creatinine, Ser 0.82; Potassium 4.0; Sodium 137; TSH 3.78  Recent Lipid Panel    Component Value Date/Time   CHOL 200 05/10/2014 0901   TRIG 124 05/10/2014 0901   HDL 75 05/10/2014 0901   CHOLHDL 2.7 05/10/2014 0901   VLDL 25 05/10/2014 0901   LDLCALC 100 (H) 05/10/2014 0901     Risk Assessment/Calculations:            Physical Exam:    VS:  BP (!) 142/68 (BP Location: Left Arm, Patient Position: Sitting, Cuff Size: Normal)   Pulse (!) 57   Ht 5\' 2"  (1.575 m)   Wt 109 lb (49.4 kg)   LMP 12/30/1983   SpO2 99%   BMI 19.94 kg/m     Wt Readings from Last 3 Encounters:  03/18/23 109 lb (49.4 kg)  02/25/23 107 lb (48.5 kg)  01/14/23 106 lb (48.1  kg)     GEN:  Well nourished, well developed in no acute distress HEENT: Normal NECK: No JVD; No carotid bruits LYMPHATICS: No lymphadenopathy CARDIAC: RRR, gr 2/6 systolic murmur RUSB, no rubs, gallops RESPIRATORY:  Clear to auscultation without rales, wheezing or rhonchi  ABDOMEN: Soft, non-tender, non-distended MUSCULOSKELETAL:  No edema; No deformity  SKIN: Warm and dry NEUROLOGIC:  Alert and oriented x 3 PSYCHIATRIC:  Normal affect  ASSESSMENT:    1. Primary hypertension   2. Murmur, cardiac    PLAN:    In order of problems listed above:  HTN primary. BP has increased since coming off atenolol and chlorthalidone. Recommend increasing losartan to 100 mg daily. If BP not controlled I would recommend starting her on chlorthalidone. Notes indicate history of CKD stage 3 but reviewing recent labs indicates this is not the case Grade 1 diastolic dysfunction. She does not have clinical CHF and so I would not recommend Jardiance especially with history of UTIs. Echo findings c/w age and history of HTN. Murmur. I reviewed her Echo. Really minimal valve regurgitation. I think the murmur is functional with some turbulence in the LVOT due to basal septum and MV creating some narrowing. No further evaluation needed.     Will follow up as needed.      Medication Adjustments/Labs and Tests Ordered: Current medicines are reviewed at length with the patient today.  Concerns regarding medicines are outlined above.  Orders Placed This Encounter  Procedures   EKG 12-Lead   Meds ordered this encounter  Medications   losartan (COZAAR) 100 MG tablet    Sig: Take 1 tablet (100 mg total) by mouth daily.    Dispense:  90 tablet    Refill:  3    Patient Instructions  Medication Instructions:  Increase Losartan to 100 mg daily Continue all other medications    Lab Work: None ordered   Testing/Procedures: None ordered   Follow-Up: At [facility], you and your health  needs are our priority.  As part of our continuing mission to provide you with exceptional heart care, we have created designated Provider Care Teams.  These Care Teams include your primary Cardiologist (physician) and Advanced Practice Providers (APPs -  Physician Assistants and Nurse Practitioners) who all work together to provide you with the care you need, when you need it.  We recommend signing up for the patient portal called "MyChart".  Sign up information is provided on this After Visit Summary.  MyChart is used to connect with patients for Virtual Visits (Telemedicine).  Patients are able to view lab/test results, encounter notes, upcoming appointments, etc.  Non-urgent messages can be sent to your provider as well.   To learn more about what you can do with MyChart, go to [email or web address].    Your next appointment:  As Needed    Provider:  Dr.Nissan Frazzini     Signed, Jahliyah Trice [place], MD  03/18/2023 3:48 PM    [place]

## 2023-03-18 ENCOUNTER — Encounter: Payer: Self-pay | Admitting: Cardiology

## 2023-03-18 ENCOUNTER — Ambulatory Visit: Payer: Medicare Other | Attending: Cardiology | Admitting: Cardiology

## 2023-03-18 VITALS — BP 142/68 | HR 57 | Ht 62.0 in | Wt 109.0 lb

## 2023-03-18 DIAGNOSIS — R011 Cardiac murmur, unspecified: Secondary | ICD-10-CM | POA: Insufficient documentation

## 2023-03-18 DIAGNOSIS — I10 Essential (primary) hypertension: Secondary | ICD-10-CM | POA: Diagnosis present

## 2023-03-18 MED ORDER — LOSARTAN POTASSIUM 100 MG PO TABS: 100.0000 mg | ORAL_TABLET | Freq: Every day | ORAL | 3 refills | Status: DC

## 2023-03-18 NOTE — Patient Instructions (Signed)
Medication Instructions:  Increase Losartan to 100 mg daily Continue all other medications    Lab Work: None ordered   Testing/Procedures: None ordered   Follow-Up: At [facility], you and your health needs are our priority.  As part of our continuing mission to provide you with exceptional heart care, we have created designated Provider Care Teams.  These Care Teams include your primary Cardiologist (physician) and Advanced Practice Providers (APPs -  Physician Assistants and Nurse Practitioners) who all work together to provide you with the care you need, when you need it.  We recommend signing up for the patient portal called "MyChart".  Sign up information is provided on this After Visit Summary.  MyChart is used to connect with patients for Virtual Visits (Telemedicine).  Patients are able to view lab/test results, encounter notes, upcoming appointments, etc.  Non-urgent messages can be sent to your provider as well.   To learn more about what you can do with MyChart, go to [email or web address].    Your next appointment:  As Needed    Provider:  Dr.Jordan

## 2023-04-16 ENCOUNTER — Encounter: Payer: Self-pay | Admitting: Internal Medicine

## 2023-04-16 ENCOUNTER — Ambulatory Visit (INDEPENDENT_AMBULATORY_CARE_PROVIDER_SITE_OTHER): Payer: Medicare Other | Admitting: Internal Medicine

## 2023-04-16 VITALS — BP 134/82 | HR 77 | Temp 98.0°F | Ht 62.0 in | Wt 105.0 lb

## 2023-04-16 DIAGNOSIS — I10 Essential (primary) hypertension: Secondary | ICD-10-CM

## 2023-04-16 DIAGNOSIS — E785 Hyperlipidemia, unspecified: Secondary | ICD-10-CM

## 2023-04-16 DIAGNOSIS — W5501XA Bitten by cat, initial encounter: Secondary | ICD-10-CM | POA: Diagnosis not present

## 2023-04-16 DIAGNOSIS — S61452A Open bite of left hand, initial encounter: Secondary | ICD-10-CM | POA: Insufficient documentation

## 2023-04-16 DIAGNOSIS — Z23 Encounter for immunization: Secondary | ICD-10-CM | POA: Diagnosis not present

## 2023-04-16 DIAGNOSIS — N1832 Chronic kidney disease, stage 3b: Secondary | ICD-10-CM

## 2023-04-16 LAB — BASIC METABOLIC PANEL
BUN: 26 mg/dL — ABNORMAL HIGH (ref 6–23)
CO2: 29 mEq/L (ref 19–32)
Calcium: 9.2 mg/dL (ref 8.4–10.5)
Chloride: 99 mEq/L (ref 96–112)
Creatinine, Ser: 0.81 mg/dL (ref 0.40–1.20)
GFR: 70.48 mL/min (ref >60.00)
Glucose, Bld: 91 mg/dL (ref 70–99)
Potassium: 4.1 mEq/L (ref 3.5–5.1)
Sodium: 137 mEq/L (ref 135–145)

## 2023-04-16 LAB — LIPID PANEL
Cholesterol: 214 mg/dL — ABNORMAL HIGH (ref 0–200)
HDL: 75.1 mg/dL (ref >39.00)
LDL Cholesterol: 119 mg/dL — ABNORMAL HIGH (ref 0–99)
NonHDL: 139.37
Total CHOL/HDL Ratio: 3
Triglycerides: 102 mg/dL (ref 0.0–149.0)
VLDL: 20.4 mg/dL (ref 0.0–40.0)

## 2023-04-16 MED ORDER — ROSUVASTATIN CALCIUM 5 MG PO TABS
5.0000 mg | ORAL_TABLET | Freq: Every day | ORAL | 0 refills | Status: DC
Start: 2023-04-16 — End: 2023-07-06

## 2023-04-16 NOTE — Progress Notes (Signed)
Subjective:  Patient ID: Tammy Bowen, female    DOB: February 09, 1946  Age: 77 y.o. MRN: [id number]  CC: Hypertension   HPI [name] presents for f/up ---  She sustained a cat bite left hand 2 weeks ago. The area has healed and does not bother her.  Outpatient Medications Prior to Visit  Medication Sig Dispense Refill   B Complex Vitamins (VITAMIN B COMPLEX) TABS Take 1 tablet by mouth daily.     calcium carbonate (SUPER CALCIUM) 1500 (600 Ca) MG TABS tablet Take 1 tablet by mouth daily.     cholecalciferol 25 MCG (1000 UT) tablet Take 1,000 Units by mouth.     estradiol (ESTRACE) 0.1 MG/GM vaginal cream Use 1/2 g vaginally two or three times per week 42.5 g 2   losartan (COZAAR) 100 MG tablet Take 1 tablet (100 mg total) by mouth daily. 90 tablet 3   loteprednol (LOTEMAX) 0.5 % ophthalmic suspension as needed.     No facility-administered medications prior to visit.    ROS Review of Systems  Constitutional: Negative.  Negative for diaphoresis and fatigue.  HENT: Negative.    Eyes: Negative.   Respiratory: Negative.  Negative for chest tightness, shortness of breath and wheezing.   Cardiovascular:  Negative for chest pain, palpitations and leg swelling.  Gastrointestinal:  Negative for abdominal pain, constipation, diarrhea, nausea and vomiting.  Endocrine: Negative.   Genitourinary: Negative.  Negative for dysuria.  Musculoskeletal:  Negative for myalgias.  Skin:  Positive for wound. Negative for rash.  Neurological: Negative.  Negative for dizziness and weakness.  Hematological:  Negative for adenopathy. Does not bruise/bleed easily.  Psychiatric/Behavioral: Negative.      Objective:  BP 134/82 (BP Location: Left Arm, Patient Position: Sitting, Cuff Size: Normal)   Pulse 77   Temp 98 F (36.7 C) (Oral)   Ht  (1.575 m)   Wt 105 lb (47.6 kg)   LMP 12/30/1983   SpO2 98%   BMI 19.20 kg/m   BP Readings from Last 3 Encounters:  04/16/23 134/82   03/18/23 (!) 142/68  02/25/23 120/64    Wt Readings from Last 3 Encounters:  04/16/23 105 lb (47.6 kg)  03/18/23 109 lb (49.4 kg)  02/25/23 107 lb (48.5 kg)    Physical Exam Vitals reviewed.  HENT:     Mouth/Throat:     Mouth: Mucous membranes are moist.  Eyes:     General: No scleral icterus.    Conjunctiva/sclera: Conjunctivae normal.  Cardiovascular:     Rate and Rhythm: Normal rate and regular rhythm.     Heart sounds: Murmur heard.     Systolic murmur is present with a grade of 1/6.     No friction rub. No gallop.  Pulmonary:     Effort: Pulmonary effort is normal.     Breath sounds: No stridor. No wheezing, rhonchi or rales.  Abdominal:     General: Abdomen is flat.     Palpations: There is no mass.     Tenderness: There is no abdominal tenderness. There is no guarding.     Hernia: No hernia is present.  Musculoskeletal:     Right lower leg: No edema.     Left lower leg: No edema.  Skin:    General: Skin is warm and dry.     Findings: No erythema or rash.  Neurological:     General: No focal deficit present.     Mental Status: She is alert.  Lab Results  Component Value Date   WBC 4.5 05/10/2014   HGB 12.1 05/10/2014   HCT 37.5 05/10/2014   PLT 265 05/10/2014   GLUCOSE 91 04/16/2023   CHOL 214 (H) 04/16/2023   TRIG 102.0 04/16/2023   HDL 75.10 04/16/2023   LDLCALC 119 (H) 04/16/2023   ALT 13 05/10/2014   AST 21 05/10/2014   NA 137 04/16/2023   K 4.1 04/16/2023   CL 99 04/16/2023   CREATININE 0.81 04/16/2023   BUN 26 (H) 04/16/2023   CO2 29 04/16/2023   TSH 3.78 01/12/2023    No results found.  Assessment & Plan:   Cat bite of left hand, initial encounter- There is no evidence of infection. -     Td vaccine greater than or equal to 7yo preservative free IM  Dyslipidemia, goal LDL below 130 - Will start a statin for CV risk reduction  -     Lipid panel; Future -     Rosuvastatin Calcium; Take 1 tablet (5 mg total) by mouth daily.   Dispense: 90 tablet; Refill: 0  Primary hypertension - Her BP is well controlled. -     Basic metabolic panel; Future  Stage 3b chronic kidney disease -     Basic metabolic panel; Future     Follow-up: Return in about 6 months (around 10/16/2023).  Sanda Linger, MD

## 2023-04-16 NOTE — Patient Instructions (Signed)
Hypertension, Adult High blood pressure (hypertension) is when the force of blood pumping through the arteries is too strong. The arteries are the blood vessels that carry blood from the heart throughout the body. Hypertension forces the heart to work harder to pump blood and may cause arteries to become narrow or stiff. Untreated or uncontrolled hypertension can lead to a heart attack, heart failure, a stroke, kidney disease, and other problems. A blood pressure reading consists of a higher number over a lower number. Ideally, your blood pressure should be below 120/80. The first ("top") number is called the systolic pressure. It is a measure of the pressure in your arteries as your heart beats. The second ("bottom") number is called the diastolic pressure. It is a measure of the pressure in your arteries as the heart relaxes. What are the causes? The exact cause of this condition is not known. There are some conditions that result in high blood pressure. What increases the risk? Certain factors may make you more likely to develop high blood pressure. Some of these risk factors are under your control, including: Smoking. Not getting enough exercise or physical activity. Being overweight. Having too much fat, sugar, calories, or salt (sodium) in your diet. Drinking too much alcohol. Other risk factors include: Having a personal history of heart disease, diabetes, high cholesterol, or kidney disease. Stress. Having a family history of high blood pressure and high cholesterol. Having obstructive sleep apnea. Age. The risk increases with age. What are the signs or symptoms? High blood pressure may not cause symptoms. Very high blood pressure (hypertensive crisis) may cause: Headache. Fast or irregular heartbeats (palpitations). Shortness of breath. Nosebleed. Nausea and vomiting. Vision changes. Severe chest pain, dizziness, and seizures. How is this diagnosed? This condition is diagnosed by  measuring your blood pressure while you are seated, with your arm resting on a flat surface, your legs uncrossed, and your feet flat on the floor. The cuff of the blood pressure monitor will be placed directly against the skin of your upper arm at the level of your heart. Blood pressure should be measured at least twice using the same arm. Certain conditions can cause a difference in blood pressure between your right and left arms. If you have a high blood pressure reading during one visit or you have normal blood pressure with other risk factors, you may be asked to: Return on a different day to have your blood pressure checked again. Monitor your blood pressure at home for 1 week or longer. If you are diagnosed with hypertension, you may have other blood or imaging tests to help your health care provider understand your overall risk for other conditions. How is this treated? This condition is treated by making healthy lifestyle changes, such as eating healthy foods, exercising more, and reducing your alcohol intake. You may be referred for counseling on a healthy diet and physical activity. Your health care provider may prescribe medicine if lifestyle changes are not enough to get your blood pressure under control and if: Your systolic blood pressure is above 130. Your diastolic blood pressure is above 80. Your personal target blood pressure may vary depending on your medical conditions, your age, and other factors. Follow these instructions at home: Eating and drinking  Eat a diet that is high in fiber and potassium, and low in sodium, added sugar, and fat. An example of this eating plan is called the DASH diet. DASH stands for Dietary Approaches to Stop Hypertension. To eat this way: Eat   plenty of fresh fruits and vegetables. Try to fill one half of your plate at each meal with fruits and vegetables. Eat whole grains, such as whole-wheat pasta, brown rice, or whole-grain bread. Fill about one  fourth of your plate with whole grains. Eat or drink low-fat dairy products, such as skim milk or low-fat yogurt. Avoid fatty cuts of meat, processed or cured meats, and poultry with skin. Fill about one fourth of your plate with lean proteins, such as fish, chicken without skin, beans, eggs, or tofu. Avoid pre-made and processed foods. These tend to be higher in sodium, added sugar, and fat. Reduce your daily sodium intake. Many people with hypertension should eat less than 1,500 mg of sodium a day. Do not drink alcohol if: Your health care provider tells you not to drink. You are pregnant, may be pregnant, or are planning to become pregnant. If you drink alcohol: Limit how much you have to: 0-1 drink a day for women. 0-2 drinks a day for men. Know how much alcohol is in your drink. In the [place] one drink equals one 12 oz bottle of beer (355 mL), one 5 oz glass of wine (148 mL), or one 1 oz glass of hard liquor (44 mL). Lifestyle  Work with your health care provider to maintain a healthy body weight or to lose weight. Ask what an ideal weight is for you. Get at least 30 minutes of exercise that causes your heart to beat faster (aerobic exercise) most days of the week. Activities may include walking, swimming, or biking. Include exercise to strengthen your muscles (resistance exercise), such as Pilates or lifting weights, as part of your weekly exercise routine. Try to do these types of exercises for 30 minutes at least 3 days a week. Do not use any products that contain nicotine or tobacco. These products include cigarettes, chewing tobacco, and vaping devices, such as e-cigarettes. If you need help quitting, ask your health care provider. Monitor your blood pressure at home as told by your health care provider. Keep all follow-up visits. This is important. Medicines Take over-the-counter and prescription medicines only as told by your health care provider. Follow directions carefully. Blood  pressure medicines must be taken as prescribed. Do not skip doses of blood pressure medicine. Doing this puts you at risk for problems and can make the medicine less effective. Ask your health care provider about side effects or reactions to medicines that you should watch for. Contact a health care provider if you: Think you are having a reaction to a medicine you are taking. Have headaches that keep coming back (recurring). Feel dizzy. Have swelling in your ankles. Have trouble with your vision. Get help right away if you: Develop a severe headache or confusion. Have unusual weakness or numbness. Feel faint. Have severe pain in your chest or abdomen. Vomit repeatedly. Have trouble breathing. These symptoms may be an emergency. Get help right away. Call 911. Do not wait to see if the symptoms will go away. Do not drive yourself to the hospital. Summary Hypertension is when the force of blood pumping through your arteries is too strong. If this condition is not controlled, it may put you at risk for serious complications. Your personal target blood pressure may vary depending on your medical conditions, your age, and other factors. For most people, a normal blood pressure is less than 120/80. Hypertension is treated with lifestyle changes, medicines, or a combination of both. Lifestyle changes include losing weight, eating a healthy,   low-sodium diet, exercising more, and limiting alcohol. This information is not intended to replace advice given to you by your health care provider. Make sure you discuss any questions you have with your health care provider. Document Revised: 10/22/2021 Document Reviewed: 10/22/2021 Elsevier Patient Education  2023 [employer]  

## 2023-07-06 ENCOUNTER — Other Ambulatory Visit: Payer: Self-pay | Admitting: Internal Medicine

## 2023-07-06 DIAGNOSIS — E785 Hyperlipidemia, unspecified: Secondary | ICD-10-CM

## 2023-07-20 ENCOUNTER — Ambulatory Visit: Payer: Medicare Other | Admitting: Internal Medicine

## 2023-08-10 ENCOUNTER — Encounter: Payer: Self-pay | Admitting: Obstetrics and Gynecology

## 2023-08-10 ENCOUNTER — Ambulatory Visit (INDEPENDENT_AMBULATORY_CARE_PROVIDER_SITE_OTHER): Payer: Medicare Other | Admitting: Obstetrics and Gynecology

## 2023-08-10 VITALS — BP 122/80 | HR 88 | Wt 106.0 lb

## 2023-08-10 DIAGNOSIS — N816 Rectocele: Secondary | ICD-10-CM

## 2023-08-10 DIAGNOSIS — N760 Acute vaginitis: Secondary | ICD-10-CM

## 2023-08-10 DIAGNOSIS — T8369XD Infection and inflammatory reaction due to other prosthetic device, implant and graft in genital tract, subsequent encounter: Secondary | ICD-10-CM

## 2023-08-10 DIAGNOSIS — N8110 Cystocele, unspecified: Secondary | ICD-10-CM | POA: Diagnosis not present

## 2023-08-10 DIAGNOSIS — Z4689 Encounter for fitting and adjustment of other specified devices: Secondary | ICD-10-CM | POA: Diagnosis not present

## 2023-08-10 NOTE — Progress Notes (Signed)
GYNECOLOGY  VISIT   HPI: 77 y.o.   Married  Caucasian  female   [id number] with Patient's last menstrual period was 12/30/1983.   here for pessary check. Patient states she has bleeding when taking pessary out. She removes the pessary every 6 - 8 weeks.   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    Using vaginal estrogen cream 0.5 - 1 gram three times a week.   Has resumed sexual activity and wants a pessary she can remove more easily.   She brings in some old pessaries to consider using again.   GYNECOLOGIC HISTORY: Patient's last menstrual period was 12/30/1983. Contraception:  hysterectomy Menopausal hormone therapy:  estrace vaginal cream Last mammogram:  11-25-22 category b birads 1:neg Last pap smear:   years ago        OB History     Gravida  3   Para  2   Term      Preterm      AB  1   Living  1      SAB      IAB  1   Ectopic      Multiple      Live Births                 Patient Active Problem List   Diagnosis Date Noted   Cat bite of left hand 04/16/2023   Dyslipidemia, goal LDL below 130 04/16/2023   Chronic heart failure with preserved ejection fraction (HCC) 02/06/2023   Grade 1 out of 6 intensity murmur 01/12/2023   Primary hypertension 01/12/2023   Stage 3b chronic kidney disease (HCC) 01/12/2023   Basal cell carcinoma of nose 09/30/2022   Need for influenza vaccination 12/19/2016   Ganglion cyst of right foot 04/25/2016   Low bone mass 04/25/2016   Thyroid nodule 04/25/2016   Gastro-esophageal reflux disease with esophagitis 04/24/2016   Osteoarthritis 04/24/2016    Past Medical History:  Diagnosis Date   Cystocele 06/15/00   Hypertension    MVP (mitral valve prolapse)    Osteoporosis    bilateral hips   Thyroid nodule 05/06/16   right and left thyroid nodule     Past Surgical History:  Procedure Laterality Date   back fusion  2004, 1990   lumbar   BREAST BIOPSY  Left    TUBAL LIGATION     BTSP   VAGINAL HYSTERECTOMY  1980's   Prolapsa    Current Outpatient Medications  Medication Sig Dispense Refill   B Complex Vitamins (VITAMIN B COMPLEX) TABS Take 1 tablet by mouth daily.     calcium carbonate (SUPER CALCIUM) 1500 (600 Ca) MG TABS tablet Take 1 tablet by mouth daily.     cholecalciferol 25 MCG (1000 UT) tablet Take 1,000 Units by mouth.     estradiol (ESTRACE) 0.1 MG/GM vaginal cream Use 1/2 g vaginally two or three times per week 42.5 g 2   losartan (COZAAR) 100 MG tablet Take 1 tablet (100 mg total) by mouth daily. 90 tablet 3   loteprednol (LOTEMAX) 0.5 % ophthalmic suspension as needed.     rosuvastatin (CRESTOR) 5 MG tablet Take 1 tablet (5 mg total) by mouth daily. 90 tablet 0   No current facility-administered medications for this visit.     ALLERGIES: Thimerosal (thiomersal)  Family History  Problem Relation Age of Onset   Stroke Mother    Thyroid disease Sister  Hypertension Maternal Aunt    Breast cancer Niece 52   Breast cancer Niece     Social History   Socioeconomic History   Marital status: Married    Spouse name: Not on file   Number of children: Not on file   Years of education: Not on file   Highest education level: Master's degree (e.g., MA, MS, MEng, MEd, MSW, MBA)  Occupational History   Not on file  Tobacco Use   Smoking status: Former    Types: Cigars    Quit date: 05/09/1973    Years since quitting: 50.2   Smokeless tobacco: Never   Tobacco comments:    In college a little  Vaping Use   Vaping status: Never Used  Substance and Sexual Activity   Alcohol use: Yes    Alcohol/week: 7.0 standard drinks of alcohol    Types: 7 Glasses of wine per week    Comment: a glass of wine at night, occ beer   Drug use: No   Sexual activity: Not Currently    Partners: Male    Birth control/protection: Post-menopausal, Surgical    Comment: TVH  Other Topics Concern   Not on file  Social History Narrative    Not on file   Social Determinants of Health   Financial Resource Strain: Not on file  Food Insecurity: Not on file  Transportation Needs: No Transportation Needs (04/15/2023)   PRAPARE - Administrator, Civil Service (Medical): No    Lack of Transportation (Non-Medical): No  Physical Activity: Insufficiently Active (04/15/2023)   Exercise Vital Sign    Days of Exercise per Week: 2 days    Minutes of Exercise per Session: 30 min  Stress: No Stress Concern Present (04/15/2023)   [employer] of Occupational Health - Occupational Stress Questionnaire    Feeling of Stress : Not at all  Social Connections: Socially Integrated (04/15/2023)   Social Connection and Isolation Panel [NHANES]    Frequency of Communication with Friends and Family: More than three times a week    Frequency of Social Gatherings with Friends and Family: Once a week    Attends Religious Services: More than 4 times per year    Active Member of [employer] or Organizations: Yes    Attends Engineer, structural: More than 4 times per year    Marital Status: Married  Catering manager Violence: Not on file    Review of Systems  Constitutional: Negative.   HENT: Negative.    Eyes: Negative.   Respiratory: Negative.    Cardiovascular: Negative.   Gastrointestinal: Negative.   Endocrine: Negative.   Genitourinary:        Some bleeding with taking pessary out  Musculoskeletal: Negative.   Skin: Negative.   Allergic/Immunologic: Negative.   Neurological: Negative.   Hematological: Negative.   Psychiatric/Behavioral: Negative.      PHYSICAL EXAMINATION:    BP 122/80   Pulse 88   Wt 106 lb (48.1 kg)   LMP 12/30/1983   SpO2 98%   BMI 19.39 kg/m     General appearance: alert, cooperative and appears stated age Head: Normocephalic, without obvious abnormality, atraumatic  Pelvic: External genitalia:  no lesions              Urethra:  normal appearing urethra with no masses, tenderness or  lesions              Bartholins and Skenes: normal  Vagina: normal appearing vagina with normal color and discharge, no lesions.  Second degree cystocele, and first degree rectocele.  Good vaginal cuff support.  Vaginal cuff erythema and bleeds.  Left vaginal wall ulceration 1 x 2 cm.               Cervix: absent.                 Bimanual Exam:  Uterus:  absent              Adnexa: no mass, fullness, tenderness         Incontinence dish removed and given to patient in biobag.  The patient's own #3 ring with support pessary cleansed and placed.  Feels comfortable to her.  Chaperone was present for exam:  Joy J, CMA  ASSESSMENT  Pelvic organ prolapse.    PLAN  Leave pessary out for at least 3 days.  Vaginal estrogen 1/2 gm pv at hs x 2 weeks and then place 1/2 gram vaginally 2 - 3 times weekly.  No sexual activity for 2 weeks.  We discussed surgery as an alternative tx to pessary. Follow up in 6 weeks for a recheck.   27 min  total time was spent for this patient encounter, including preparation, face-to-face counseling with the patient, coordination of care, and documentation of the encounter.

## 2023-08-13 ENCOUNTER — Encounter (INDEPENDENT_AMBULATORY_CARE_PROVIDER_SITE_OTHER): Payer: Self-pay

## 2023-08-27 ENCOUNTER — Ambulatory Visit (INDEPENDENT_AMBULATORY_CARE_PROVIDER_SITE_OTHER): Payer: Medicare Other

## 2023-08-27 VITALS — Ht 62.0 in | Wt 105.0 lb

## 2023-08-27 DIAGNOSIS — Z0000 Encounter for general adult medical examination without abnormal findings: Secondary | ICD-10-CM

## 2023-08-27 NOTE — Patient Instructions (Addendum)
Tammy Bowen , Thank you for taking time to come for your Medicare Wellness Visit. I appreciate your ongoing commitment to your health goals. Please review the following plan we discussed and let me know if I can assist you in the future.   Referrals/Orders/Follow-Ups/Clinician Recommendations: No  This is a list of the screening recommended for you and due dates:  Health Maintenance  Topic Date Due   Hepatitis C Screening  Never done   COVID-19 Vaccine (9 - 2023-24 season) 11/25/2022   Medicare Annual Wellness Visit  08/26/2024   DTaP/Tdap/Td vaccine (3 - Td or Tdap) 04/15/2033   Pneumonia Vaccine  Completed   Flu Shot  Completed   DEXA scan (bone density measurement)  Completed   Zoster (Shingles) Vaccine  Completed   HPV Vaccine  Aged Out   Colon Cancer Screening  Discontinued    Advanced directives: (Copy Requested) Please bring a copy of your health care power of attorney and living will to the office to be added to your chart at your convenience.  Next Medicare Annual Wellness Visit scheduled for next year: Yes  Preventive Care attachment

## 2023-08-27 NOTE — Progress Notes (Signed)
Subjective:   Tammy Bowen is a 77 y.o. female who presents for Medicare Annual (Subsequent) preventive examination.  Visit Complete: Virtual  I connected with  Cyndia Diver on 08/27/23 by a audio enabled telemedicine application and verified that I am speaking with the correct person using two identifiers.  Patient Location: Home  Provider Location: Office/Clinic  I discussed the limitations of evaluation and management by telemedicine. The patient expressed understanding and agreed to proceed.  Vital Signs: Because this visit was a virtual/telehealth visit, some criteria may be missing or patient reported. Any vitals not documented were not able to be obtained and vitals that have been documented are patient reported.    Review of Systems     Cardiac Risk Factors include: advanced age (>18men, >64 women);dyslipidemia;family history of premature cardiovascular disease;hypertension;sedentary lifestyle     Objective:    Today's Vitals   08/27/23 0902  Weight: 105 lb (47.6 kg)  Height: 5\' 2"  (1.575 m)  PainSc: 0-No pain   Body mass index is 19.2 kg/m.     08/27/2023    9:04 AM 12/17/2022   11:42 AM 09/30/2022    7:49 AM 08/12/2017    1:46 PM  Advanced Directives  Does Patient Have a Medical Advance Directive? Yes No Yes No  Type of Estate agent of [place];Living will  Healthcare Power of Attorney   Does patient want to make changes to medical advance directive?   No - Patient declined   Copy of Healthcare Power of Attorney in Chart? No - copy requested  No - copy requested   Would patient like information on creating a medical advance directive?  No - Patient declined      Current Medications (verified) Outpatient Encounter Medications as of 08/27/2023  Medication Sig   B Complex Vitamins (VITAMIN B COMPLEX) TABS Take 1 tablet by mouth daily.   calcium carbonate (SUPER CALCIUM) 1500 (600 Ca) MG TABS tablet Take 1 tablet by mouth daily.    cholecalciferol 25 MCG (1000 UT) tablet Take 1,000 Units by mouth.   estradiol (ESTRACE) 0.1 MG/GM vaginal cream Use 1/2 g vaginally two or three times per week   losartan (COZAAR) 100 MG tablet Take 1 tablet (100 mg total) by mouth daily.   loteprednol (LOTEMAX) 0.5 % ophthalmic suspension as needed.   rosuvastatin (CRESTOR) 5 MG tablet Take 1 tablet (5 mg total) by mouth daily.   No facility-administered encounter medications on file as of 08/27/2023.    Allergies (verified) Thimerosal (thiomersal)   History: Past Medical History:  Diagnosis Date   Cystocele 06/15/00   Hypertension    MVP (mitral valve prolapse)    Osteoporosis    bilateral hips   Thyroid nodule 05/06/16   right and left thyroid nodule    Past Surgical History:  Procedure Laterality Date   back fusion  2004, 1990   lumbar   BREAST BIOPSY Left    TUBAL LIGATION     BTSP   VAGINAL HYSTERECTOMY  1980's   Prolapsa   Family History  Problem Relation Age of Onset   Stroke Mother    Thyroid disease Sister    Hypertension Maternal Aunt    Breast cancer Niece 2   Breast cancer Niece    Social History   Socioeconomic History   Marital status: Married    Spouse name: Not on file   Number of children: Not on file   Years of education: Not on file   Highest  education level: Master's degree (e.g., MA, MS, MEng, MEd, MSW, MBA)  Occupational History   Not on file  Tobacco Use   Smoking status: Former    Types: Cigars    Quit date: 05/09/1973    Years since quitting: 50.3   Smokeless tobacco: Never   Tobacco comments:    In college a little  Vaping Use   Vaping status: Never Used  Substance and Sexual Activity   Alcohol use: Yes    Alcohol/week: 7.0 standard drinks of alcohol    Types: 7 Glasses of wine per week    Comment: a glass of wine at night, occ beer   Drug use: No   Sexual activity: Not Currently    Partners: Male    Birth control/protection: Post-menopausal, Surgical    Comment: TVH   Other Topics Concern   Not on file  Social History Narrative   Not on file   Social Determinants of Health   Financial Resource Strain: Low Risk  (08/27/2023)   Overall Financial Resource Strain (CARDIA)    Difficulty of Paying Living Expenses: Not hard at all  Food Insecurity: No Food Insecurity (08/27/2023)   Hunger Vital Sign    Worried About Running Out of Food in the Last Year: Never true    Ran Out of Food in the Last Year: Never true  Transportation Needs: No Transportation Needs (08/27/2023)   PRAPARE - Administrator, Civil Service (Medical): No    Lack of Transportation (Non-Medical): No  Physical Activity: Insufficiently Active (08/27/2023)   Exercise Vital Sign    Days of Exercise per Week: 2 days    Minutes of Exercise per Session: 30 min  Stress: No Stress Concern Present (08/27/2023)   [employer] of Occupational Health - Occupational Stress Questionnaire    Feeling of Stress : Not at all  Social Connections: Socially Integrated (08/27/2023)   Social Connection and Isolation Panel [NHANES]    Frequency of Communication with Friends and Family: More than three times a week    Frequency of Social Gatherings with Friends and Family: Once a week    Attends Religious Services: More than 4 times per year    Active Member of [employer] or Organizations: Yes    Attends Engineer, structural: More than 4 times per year    Marital Status: Married    Tobacco Counseling Counseling given: Not Answered Tobacco comments: In college a little   Clinical Intake:  Pre-visit preparation completed: Yes  Pain : No/denies pain Pain Score: 0-No pain     BMI - recorded: 19.2 Nutritional Status: BMI of 19-24  Normal Nutritional Risks: None Diabetes: No  How often do you need to have someone help you when you read instructions, pamphlets, or other written materials from your doctor or pharmacy?: 1 - Never What is the last grade level you completed in  school?: Graduate Degree  Interpreter Needed?: No  Information entered by :: Ignatz Deis N. Saleah Rishel, LPN.   Activities of Daily Living    08/27/2023    9:08 AM  In your present state of health, do you have any difficulty performing the following activities:  Hearing? 0  Vision? 0  Difficulty concentrating or making decisions? 0  Walking or climbing stairs? 0  Dressing or bathing? 0  Doing errands, shopping? 0  Preparing Food and eating ? N  Using the Toilet? N  In the past six months, have you accidently leaked urine? Y  Do you have  problems with loss of bowel control? N  Managing your Medications? N  Managing your Finances? N  Housekeeping or managing your Housekeeping? N    Patient Care Team: Etta Grandchild, MD as PCP - General (Internal Medicine) Malmfelt, Lise Auer, RN as Oncology Nurse Navigator Lonie Peak, MD as Consulting Physician (Radiation Oncology) Mathews Robinsons, MD as Consulting Physician (Dermatology) Ardell Isaacs, Forrestine Him, MD as Consulting Physician (Obstetrics and Gynecology) Maris Berger, MD as Consulting Physician (Ophthalmology)  Indicate any recent Medical Services you may have received from other than [facility] providers in the past year (date may be approximate).     Assessment:   This is a routine wellness examination for [facility].  Hearing/Vision screen Hearing Screening - Comments:: Patient has difficulty hearing, has hearing aids but does not wear them. Vision Screening - Comments:: Wears rx glasses - up to date with routine eye exams with Maris Berger, MD.   Dietary issues and exercise activities discussed:     Goals Addressed               This Visit's Progress     Patient Stated (pt-stated)        To eat well, stay alive and spend time with family.      Depression Screen    08/27/2023    9:07 AM 02/25/2023    3:28 PM 01/12/2023    8:49 AM 08/12/2017    1:46 PM  PHQ 2/9 Scores  PHQ - 2 Score 0 0 0 0  PHQ- 9  Score 0 0 0   Exception Documentation    Other- indicate reason in comment box    Fall Risk    08/27/2023    9:05 AM 08/10/2023   10:54 AM 02/25/2023    3:27 PM 01/12/2023    8:49 AM 08/12/2017    1:46 PM  Fall Risk   Falls in the past year? 0 0 0 0 No  Number falls in past yr: 0 0 0 0   Injury with Fall? 0 0 0 0   Risk for fall due to : No Fall Risks No Fall Risks No Fall Risks No Fall Risks Other (Comment)  Follow up Falls prevention discussed Falls evaluation completed Falls evaluation completed Falls evaluation completed     MEDICARE RISK AT HOME: Medicare Risk at Home Any stairs in or around the home?: Yes If so, are there any without handrails?: No Home free of loose throw rugs in walkways, pet beds, electrical cords, etc?: Yes Adequate lighting in your home to reduce risk of falls?: Yes Life alert?: No Use of a cane, walker or w/c?: No Grab bars in the bathroom?: No Shower chair or bench in shower?: Yes (stone seat) Elevated toilet seat or a handicapped toilet?: No  TIMED UP AND GO:  Was the test performed?  No    Cognitive Function:        08/27/2023    9:14 AM  6CIT Screen  What Year? 0 points  What month? 0 points  What time? 0 points  Count back from 20 0 points  Months in reverse 0 points  Repeat phrase 0 points  Total Score 0 points    Immunizations Immunization History  Administered Date(s) Administered   COVID-19, mRNA, vaccine(Comirnaty)12 years and older 09/30/2022   Influenza, High Dose Seasonal PF 12/19/2016, 11/05/2017, 09/17/2022   Influenza-Unspecified 12/19/2016, 11/05/2017, 08/26/2023   PFIZER Comirnaty(Gray Top)Covid-19 Tri-Sucrose Vaccine 01/20/2020, 02/10/2020, 09/28/2020, 05/29/2021   PFIZER(Purple Top)SARS-COV-2 Vaccination 01/20/2020, 02/10/2020  Research officer, trade union 40yrs & up 10/17/2021   Pneumococcal Conjugate-13 08/30/2015   Pneumococcal Polysaccharide-23 02/02/2019   Td 04/16/2023   Tdap 08/16/2007    Zoster Recombinant(Shingrix) 07/04/2020, 11/30/2020   Zoster, Live 01/28/2010    TDAP status: Up to date  Flu Vaccine status: Up to date  Pneumococcal vaccine status: Up to date  Covid-19 vaccine status: Completed vaccines  Qualifies for Shingles Vaccine? Yes   Zostavax completed Yes   Shingrix Completed?: Yes  Screening Tests Health Maintenance  Topic Date Due   Hepatitis C Screening  Never done   COVID-19 Vaccine (9 - 2023-24 season) 11/25/2022   Medicare Annual Wellness (AWV)  08/26/2024   DTaP/Tdap/Td (3 - Td or Tdap) 04/15/2033   Pneumonia Vaccine 48+ Years old  Completed   INFLUENZA VACCINE  Completed   DEXA SCAN  Completed   Zoster Vaccines- Shingrix  Completed   HPV VACCINES  Aged Out   Colonoscopy  Discontinued    Health Maintenance  Health Maintenance Due  Topic Date Due   Hepatitis C Screening  Never done   COVID-19 Vaccine (9 - 2023-24 season) 11/25/2022    Colorectal cancer screening: No longer required.   Mammogram status: Completed 11/25/2022. Repeat every year  Bone Density status: Completed 11/26/2021. Results reflect: Bone density results: OSTEOPOROSIS. Repeat every 2 years.  Lung Cancer Screening: (Low Dose CT Chest recommended if Age 27-80 years, 20 pack-year currently smoking OR have quit w/in 15years.) does not qualify.   Lung Cancer Screening Referral: no  Additional Screening:  Hepatitis C Screening: does qualify; Completed: no  Vision Screening: Recommended annual ophthalmology exams for early detection of glaucoma and other disorders of the eye. Is the patient up to date with their annual eye exam?  Yes  Who is the provider or what is the name of the office in which the patient attends annual eye exams? Maris Berger, MD. If pt is not established with a provider, would they like to be referred to a provider to establish care? No .   Dental Screening: Recommended annual dental exams for proper oral hygiene  Diabetic Foot Exam:  N/A  Community Resource Referral / Chronic Care Management: CRR required this visit?  No   CCM required this visit?  No     Plan:     I have personally reviewed and noted the following in the patient's chart:   Medical and social history Use of alcohol, tobacco or illicit drugs  Current medications and supplements including opioid prescriptions. Patient is not currently taking opioid prescriptions. Functional ability and status Nutritional status Physical activity Advanced directives List of other physicians Hospitalizations, surgeries, and ER visits in previous 12 months Vitals Screenings to include cognitive, depression, and falls Referrals and appointments  In addition, I have reviewed and discussed with patient certain preventive protocols, quality metrics, and best practice recommendations. A written personalized care plan for preventive services as well as general preventive health recommendations were provided to patient.     Mickeal Needy, LPN   02/11/864   After Visit Summary: (Mail) Due to this being a telephonic visit, the after visit summary with patients personalized plan was offered to patient via mail   Nurse Notes: Normal cognitive status assessed by direct observation via telephone conversation by this Nurse Health Advisor. No abnormalities found.

## 2023-09-02 ENCOUNTER — Encounter: Payer: Self-pay | Admitting: Internal Medicine

## 2023-09-02 ENCOUNTER — Ambulatory Visit (INDEPENDENT_AMBULATORY_CARE_PROVIDER_SITE_OTHER): Payer: Medicare Other | Admitting: Internal Medicine

## 2023-09-02 VITALS — BP 128/86 | HR 71 | Temp 97.9°F | Ht 62.0 in | Wt 107.0 lb

## 2023-09-02 DIAGNOSIS — I10 Essential (primary) hypertension: Secondary | ICD-10-CM | POA: Diagnosis not present

## 2023-09-02 DIAGNOSIS — Z1159 Encounter for screening for other viral diseases: Secondary | ICD-10-CM

## 2023-09-02 DIAGNOSIS — N1832 Chronic kidney disease, stage 3b: Secondary | ICD-10-CM

## 2023-09-02 DIAGNOSIS — K2100 Gastro-esophageal reflux disease with esophagitis, without bleeding: Secondary | ICD-10-CM

## 2023-09-02 DIAGNOSIS — E785 Hyperlipidemia, unspecified: Secondary | ICD-10-CM

## 2023-09-02 LAB — BASIC METABOLIC PANEL
BUN: 24 mg/dL — ABNORMAL HIGH (ref 6–23)
CO2: 31 meq/L (ref 19–32)
Calcium: 9.2 mg/dL (ref 8.4–10.5)
Chloride: 100 meq/L (ref 96–112)
Creatinine, Ser: 0.8 mg/dL (ref 0.40–1.20)
GFR: 71.35 mL/min (ref >60.00)
Glucose, Bld: 88 mg/dL (ref 70–99)
Potassium: 3.9 meq/L (ref 3.5–5.1)
Sodium: 138 meq/L (ref 135–145)

## 2023-09-02 LAB — CBC WITH DIFFERENTIAL/PLATELET
Basophils Absolute: 0 10*3/uL (ref 0.0–0.1)
Basophils Relative: 0.7 % (ref 0.0–3.0)
Eosinophils Absolute: 0.1 10*3/uL (ref 0.0–0.7)
Eosinophils Relative: 1.7 % (ref 0.0–5.0)
HCT: 37.9 % (ref 36.0–46.0)
Hemoglobin: 12.3 g/dL (ref 12.0–15.0)
Lymphocytes Relative: 35.9 % (ref 12.0–46.0)
Lymphs Abs: 1.4 10*3/uL (ref 0.7–4.0)
MCHC: 32.6 g/dL (ref 30.0–36.0)
MCV: 87.8 fl (ref 78.0–100.0)
Monocytes Absolute: 0.4 10*3/uL (ref 0.1–1.0)
Monocytes Relative: 9.9 % (ref 3.0–12.0)
Neutro Abs: 2.1 10*3/uL (ref 1.4–7.7)
Neutrophils Relative %: 51.8 % (ref 43.0–77.0)
Platelets: 247 10*3/uL (ref 150.0–400.0)
RBC: 4.31 Mil/uL (ref 3.87–5.11)
RDW: 14.3 % (ref 11.5–15.5)
WBC: 4 10*3/uL (ref 4.0–10.5)

## 2023-09-02 LAB — CK: Total CK: 75 U/L (ref 7–177)

## 2023-09-02 LAB — LIPID PANEL
Cholesterol: 175 mg/dL (ref 0–200)
HDL: 63.3 mg/dL (ref >39.00)
LDL Cholesterol: 92 mg/dL (ref 0–99)
NonHDL: 112.04
Total CHOL/HDL Ratio: 3
Triglycerides: 98 mg/dL (ref 0.0–149.0)
VLDL: 19.6 mg/dL (ref 0.0–40.0)

## 2023-09-02 NOTE — Progress Notes (Signed)
Subjective:  Patient ID: Tammy Bowen, female    DOB: July 06, 1946  Age: 77 y.o. MRN: [id number]  CC: Gastroesophageal Reflux, Hypertension, and Hyperlipidemia   HPI Tammy Bowen presents for f/up ----  Discussed the use of AI scribe software for clinical note transcription with the patient, who gave verbal consent to proceed.  History of Present Illness   The patient, with a history of dyslipidemia and hypertension, reports a significant improvement in their health status since the initiation of statin therapy. They express gratitude for the intervention, acknowledging a misunderstanding of their cholesterol levels for the past 20 years. They are curious about the appropriate timing for retesting their lipid profile and have raised a question about the necessity of a coronary calcium score.  The patient is also on losartan 100mg  for hypertension, which they report is well-tolerated without any symptoms of dizziness or lightheadedness. They monitor their blood pressure once or twice daily, noting that systolic readings rarely exceed 150 and are often in the 130s. Diastolic readings are typically in the 70s or 80s. They deny any associated symptoms such as headache, blurred vision, chest pain, shortness of breath, or edema.  The patient denies any new or different muscle or joint aches since starting the statin. They attribute any existing discomfort to aging and possible arthritis.       Outpatient Medications Prior to Visit  Medication Sig Dispense Refill   B Complex Vitamins (VITAMIN B COMPLEX) TABS Take 1 tablet by mouth daily.     calcium carbonate (SUPER CALCIUM) 1500 (600 Ca) MG TABS tablet Take 1 tablet by mouth daily.     cholecalciferol 25 MCG (1000 UT) tablet Take 1,000 Units by mouth.     estradiol (ESTRACE) 0.1 MG/GM vaginal cream Use 1/2 g vaginally two or three times per week 42.5 g 2   losartan (COZAAR) 100 MG tablet Take 1 tablet (100 mg total) by mouth daily. 90  tablet 3   loteprednol (LOTEMAX) 0.5 % ophthalmic suspension as needed.     rosuvastatin (CRESTOR) 5 MG tablet Take 1 tablet (5 mg total) by mouth daily. 90 tablet 0   No facility-administered medications prior to visit.    ROS Review of Systems  Constitutional:  Negative for diaphoresis and fatigue.  HENT: Negative.    Eyes: Negative.   Respiratory:  Negative for cough, chest tightness, shortness of breath and wheezing.   Cardiovascular:  Negative for chest pain, palpitations and leg swelling.  Gastrointestinal: Negative.  Negative for abdominal pain, diarrhea and nausea.  Endocrine: Negative.   Genitourinary: Negative.  Negative for difficulty urinating.  Musculoskeletal: Negative.   Skin: Negative.   Neurological: Negative.  Negative for dizziness, weakness, light-headedness and headaches.  Hematological:  Negative for adenopathy. Does not bruise/bleed easily.  Psychiatric/Behavioral: Negative.      Objective:  BP 128/86 (BP Location: Left Arm, Patient Position: Sitting, Cuff Size: Normal)   Pulse 71   Temp 97.9 F (36.6 C) (Oral)   Ht 5\' 2"  (1.575 m)   Wt 107 lb (48.5 kg)   LMP 12/30/1983   SpO2 97%   BMI 19.57 kg/m   BP Readings from Last 3 Encounters:  09/02/23 128/86  08/10/23 122/80  04/16/23 134/82    Wt Readings from Last 3 Encounters:  09/02/23 107 lb (48.5 kg)  08/27/23 105 lb (47.6 kg)  08/10/23 106 lb (48.1 kg)    Physical Exam Vitals reviewed.  Constitutional:      Appearance: Normal appearance.  HENT:  Nose: Nose normal.     Mouth/Throat:     Mouth: Mucous membranes are moist.  Eyes:     General: No scleral icterus.    Conjunctiva/sclera: Conjunctivae normal.  Cardiovascular:     Rate and Rhythm: Normal rate and regular rhythm.     Heart sounds: Murmur heard.     Systolic murmur is present with a grade of 1/6.     No diastolic murmur is present.     No friction rub. No gallop.  Pulmonary:     Effort: Pulmonary effort is normal.      Breath sounds: No stridor. No wheezing, rhonchi or rales.  Abdominal:     General: Abdomen is flat.     Palpations: There is no mass.     Tenderness: There is no abdominal tenderness. There is no guarding.     Hernia: No hernia is present.  Musculoskeletal:        General: Normal range of motion.     Cervical back: Neck supple.     Right lower leg: No edema.     Left lower leg: No edema.  Lymphadenopathy:     Cervical: No cervical adenopathy.  Skin:    General: Skin is warm and dry.  Neurological:     General: No focal deficit present.     Mental Status: She is alert. Mental status is at baseline.  Psychiatric:        Mood and Affect: Mood normal.        Behavior: Behavior normal.     Lab Results  Component Value Date   WBC 4.0 09/02/2023   HGB 12.3 09/02/2023   HCT 37.9 09/02/2023   PLT 247.0 09/02/2023   GLUCOSE 88 09/02/2023   CHOL 175 09/02/2023   TRIG 98.0 09/02/2023   HDL 63.30 09/02/2023   LDLCALC 92 09/02/2023   ALT 13 05/10/2014   AST 21 05/10/2014   NA 138 09/02/2023   K 3.9 09/02/2023   CL 100 09/02/2023   CREATININE 0.80 09/02/2023   BUN 24 (H) 09/02/2023   CO2 31 09/02/2023   TSH 3.78 01/12/2023    No results found.  Assessment & Plan:  Need for hepatitis C screening test -     Hepatitis C antibody; Future  Gastroesophageal reflux disease with esophagitis without hemorrhage- She is asx with this. -     CBC with Differential/Platelet; Future -     Basic metabolic panel; Future  Primary hypertension- BP is well controlled. -     CBC with Differential/Platelet; Future -     Basic metabolic panel; Future  Dyslipidemia, goal LDL below 130- LDL goal achieved. Doing well on the statin  -     Lipid panel; Future -     CK; Future -     CT CARDIAC SCORING (SELF PAY ONLY); Future  Stage 3b chronic kidney disease (HCC)     Follow-up: Return in about 6 months (around 03/01/2024).  Sanda Linger, MD

## 2023-09-02 NOTE — Patient Instructions (Signed)

## 2023-09-03 LAB — HEPATITIS C ANTIBODY: Hepatitis C Ab: NONREACTIVE

## 2023-09-07 NOTE — Progress Notes (Signed)
GYNECOLOGY  VISIT   HPI: 77 y.o.   Married  Caucasian  female   [id number] with Patient's last menstrual period was 12/30/1983.   here for   6 week recheck.  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    She had vaginal wall ulceration at her last visit 08/10/23. Was using an incontinence dish then.   She left the incontinence dish pessary out for 8 days and then resumed the use of a former number 3 ring with support that she had. Still some difficulty placing and removing the pessary, but it is comfortable and holds her prolapse well.  Having less urinary incontinence with her current right with support pessary.   Using estrogen cream 3 - 4 times a week.    Has been sexually active and no bleeding with intercourse.   Adopting a cat from the animal shelter.  GYNECOLOGIC HISTORY: Patient's last menstrual period was 12/30/1983. Contraception:  hyst Menopausal hormone therapy:  estrace Last mammogram:  11-25-22 category b birads 1:neg  Last pap smear:   years ago        OB History     Gravida  3   Para  2   Term      Preterm      AB  1   Living  1      SAB      IAB  1   Ectopic      Multiple      Live Births                 Patient Active Problem List   Diagnosis Date Noted   Dyslipidemia, goal LDL below 130 04/16/2023   Chronic heart failure with preserved ejection fraction (HCC) 02/06/2023   Grade 1 out of 6 intensity murmur 01/12/2023   Primary hypertension 01/12/2023   Stage 3b chronic kidney disease (HCC) 01/12/2023   Basal cell carcinoma of nose 09/30/2022   Need for influenza vaccination 12/19/2016   Ganglion cyst of right foot 04/25/2016   Low bone mass 04/25/2016   Thyroid nodule 04/25/2016   Gastro-esophageal reflux disease with esophagitis 04/24/2016   Osteoarthritis 04/24/2016    Past Medical History:  Diagnosis Date   Cystocele 06/15/00   Hypertension    MVP (mitral valve  prolapse)    Osteoporosis    bilateral hips   Thyroid nodule 05/06/16   right and left thyroid nodule     Past Surgical History:  Procedure Laterality Date   back fusion  2004, 1990   lumbar   BREAST BIOPSY Left    TUBAL LIGATION     BTSP   VAGINAL HYSTERECTOMY  1980's   Prolapsa    Current Outpatient Medications  Medication Sig Dispense Refill   B Complex Vitamins (VITAMIN B COMPLEX) TABS Take 1 tablet by mouth daily.     calcium carbonate (SUPER CALCIUM) 1500 (600 Ca) MG TABS tablet Take 1 tablet by mouth daily.     cholecalciferol 25 MCG (1000 UT) tablet Take 1,000 Units by mouth.     estradiol (ESTRACE) 0.1 MG/GM vaginal cream Use 1/2 g vaginally two or three times per week 42.5 g 2   losartan (COZAAR) 100 MG tablet Take 1 tablet (100 mg total) by mouth daily. 90 tablet 3   loteprednol (LOTEMAX) 0.5 % ophthalmic suspension as needed.     rosuvastatin (CRESTOR) 5 MG tablet Take 1 tablet (5 mg total) by mouth  daily. 90 tablet 0   No current facility-administered medications for this visit.     ALLERGIES: Thimerosal (thiomersal)  Family History  Problem Relation Age of Onset   Stroke Mother    Thyroid disease Sister    Hypertension Maternal Aunt    Breast cancer Niece 80   Breast cancer Niece     Social History   Socioeconomic History   Marital status: Married    Spouse name: Not on file   Number of children: Not on file   Years of education: Not on file   Highest education level: Master's degree (e.g., MA, MS, MEng, MEd, MSW, MBA)  Occupational History   Not on file  Tobacco Use   Smoking status: Former    Types: Cigars    Quit date: 05/09/1973    Years since quitting: 50.4   Smokeless tobacco: Never   Tobacco comments:    In college a little  Vaping Use   Vaping status: Never Used  Substance and Sexual Activity   Alcohol use: Yes    Alcohol/week: 7.0 standard drinks of alcohol    Types: 7 Glasses of wine per week    Comment: a glass of wine at night,  occ beer   Drug use: No   Sexual activity: Not Currently    Partners: Male    Birth control/protection: Post-menopausal, Surgical    Comment: [facility]  Other Topics Concern   Not on file  Social History Narrative   Not on file   Social Determinants of Health   Financial Resource Strain: Low Risk  (08/27/2023)   Overall Financial Resource Strain (CARDIA)    Difficulty of Paying Living Expenses: Not hard at all  Food Insecurity: No Food Insecurity (08/27/2023)   Hunger Vital Sign    Worried About Running Out of Food in the Last Year: Never true    Ran Out of Food in the Last Year: Never true  Transportation Needs: No Transportation Needs (08/27/2023)   PRAPARE - Administrator, Civil Service (Medical): No    Lack of Transportation (Non-Medical): No  Physical Activity: Insufficiently Active (08/27/2023)   Exercise Vital Sign    Days of Exercise per Week: 2 days    Minutes of Exercise per Session: 30 min  Stress: No Stress Concern Present (08/27/2023)   [employer] of Occupational Health - Occupational Stress Questionnaire    Feeling of Stress : Not at all  Social Connections: Socially Integrated (08/27/2023)   Social Connection and Isolation Panel [NHANES]    Frequency of Communication with Friends and Family: More than three times a week    Frequency of Social Gatherings with Friends and Family: Once a week    Attends Religious Services: More than 4 times per year    Active Member of [employer] or Organizations: Yes    Attends Banker Meetings: More than 4 times per year    Marital Status: Married  Catering manager Violence: Not At Risk (08/27/2023)   Humiliation, Afraid, Rape, and Kick questionnaire    Fear of Current or Ex-Partner: No    Emotionally Abused: No    Physically Abused: No    Sexually Abused: No    Review of Systems  All other systems reviewed and are negative.   PHYSICAL EXAMINATION:    BP 108/62   Pulse 78   Resp 12   Wt 108 lb (49  kg)   LMP 12/30/1983   BMI 19.75 kg/m     General  appearance: alert, cooperative and appears stated age   Pelvic: External genitalia:  no lesions              Urethra:  normal appearing urethra with no masses, tenderness or lesions              Bartholins and Skenes: normal                 Vagina:  inflammation of the bilateral vaginal sidewalls.  No bleeding.               Cervix: absent                Bimanual Exam:  Uterus:  absent              Adnexa: no mass, fullness, tenderness    Pessary removed, cleansed, and replaced.   Chaperone was present for exam:  Silas Flood.  ASSESSMENT  Pelvic organ prolapse.   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   Pessary maintenance.   PLAN  Ok to continue pessary use.  I recommend leaving the pessary out a night once per week.  Continue vaginal estrogen cream 1/2 gram 3 - 4 times per week.  Refills given.  FU in 10 weeks for recheck, sooner if needed. Mammogram due in November.   25 min  total time was spent for this patient encounter, including preparation, face-to-face counseling with the patient, coordination of care, and documentation of the encounter.

## 2023-09-21 ENCOUNTER — Ambulatory Visit (INDEPENDENT_AMBULATORY_CARE_PROVIDER_SITE_OTHER): Payer: Medicare Other | Admitting: Obstetrics and Gynecology

## 2023-09-21 ENCOUNTER — Encounter: Payer: Self-pay | Admitting: Obstetrics and Gynecology

## 2023-09-21 VITALS — BP 108/62 | HR 78 | Resp 12 | Wt 108.0 lb

## 2023-09-21 DIAGNOSIS — N819 Female genital prolapse, unspecified: Secondary | ICD-10-CM

## 2023-09-21 DIAGNOSIS — Z4689 Encounter for fitting and adjustment of other specified devices: Secondary | ICD-10-CM

## 2023-09-21 MED ORDER — ESTRADIOL 0.1 MG/GM VA CREA: TOPICAL_CREAM | VAGINAL | 1 refills | Status: DC

## 2023-10-06 ENCOUNTER — Other Ambulatory Visit: Payer: Self-pay | Admitting: Internal Medicine

## 2023-10-06 DIAGNOSIS — E785 Hyperlipidemia, unspecified: Secondary | ICD-10-CM

## 2023-11-10 ENCOUNTER — Ambulatory Visit: Payer: Medicare Other | Admitting: Emergency Medicine

## 2023-11-10 VITALS — BP 130/74 | HR 73 | Temp 97.9°F | Ht 62.0 in | Wt 110.0 lb

## 2023-11-10 DIAGNOSIS — R300 Dysuria: Secondary | ICD-10-CM | POA: Diagnosis not present

## 2023-11-10 DIAGNOSIS — N390 Urinary tract infection, site not specified: Secondary | ICD-10-CM | POA: Insufficient documentation

## 2023-11-10 LAB — POCT URINALYSIS DIPSTICK
Bilirubin, UA: NEGATIVE
Blood, UA: POSITIVE
Glucose, UA: NEGATIVE
Ketones, UA: NEGATIVE
Nitrite, UA: POSITIVE
Protein, UA: POSITIVE — AB
Spec Grav, UA: 1.015 (ref 1.010–1.025)
Urobilinogen, UA: 0.2 U/dL
pH, UA: 7.5 (ref 5.0–8.0)

## 2023-11-10 MED ORDER — CEFUROXIME AXETIL 250 MG PO TABS
250.0000 mg | ORAL_TABLET | Freq: Two times a day (BID) | ORAL | 0 refills | Status: AC
Start: 2023-11-10 — End: 2023-11-17

## 2023-11-10 NOTE — Progress Notes (Signed)
Tammy Bowen 77 y.o.   Chief Complaint  Patient presents with   Urinary Tract Infection    Patient states she is having pain during urination, odor,     HISTORY OF PRESENT ILLNESS: Acute problem visit today.  Patient of Dr. Sanda Linger. This is a 76 y.o. female complaining of burning with urination for the past couple days.  Possible UTI.  Urinary Tract Infection  Associated symptoms include frequency and urgency. Pertinent negatives include no chills, hematuria, nausea or vomiting.     Prior to Admission medications   Medication Sig Start Date End Date Taking? Authorizing Provider  B Complex Vitamins (VITAMIN B COMPLEX) TABS Take 1 tablet by mouth daily.    [provider]  calcium carbonate (SUPER CALCIUM) 1500 (600 Ca) MG TABS tablet Take 1 tablet by mouth daily. 04/24/16   [provider]  cholecalciferol 25 MCG (1000 UT) tablet Take 1,000 Units by mouth. 08/21/22   [provider]  estradiol (ESTRACE) 0.1 MG/GM vaginal cream Use 1/2 g vaginally three to four times per week 09/21/23   Patton Salles, MD  losartan (COZAAR) 100 MG tablet Take 1 tablet (100 mg total) by mouth daily. 03/18/23 03/12/24  [place], Peter M, MD  loteprednol (LOTEMAX) 0.5 % ophthalmic suspension as needed. 07/22/16   [provider]  rosuvastatin (CRESTOR) 5 MG tablet Take 1 tablet (5 mg total) by mouth daily. 10/06/23   Etta Grandchild, MD    Allergies  Allergen Reactions   Thimerosal (Thiomersal) Swelling    Patient Active Problem List   Diagnosis Date Noted   Dyslipidemia, goal LDL below 130 04/16/2023   Chronic heart failure with preserved ejection fraction (HCC) 02/06/2023   Grade 1 out of 6 intensity murmur 01/12/2023   Primary hypertension 01/12/2023   Stage 3b chronic kidney disease (HCC) 01/12/2023   Basal cell carcinoma of nose 09/30/2022   Need for influenza vaccination 12/19/2016   Ganglion cyst of right foot 04/25/2016   Low bone mass  04/25/2016   Thyroid nodule 04/25/2016   Gastro-esophageal reflux disease with esophagitis 04/24/2016   Osteoarthritis 04/24/2016    Past Medical History:  Diagnosis Date   Cystocele 06/15/00   Hypertension    MVP (mitral valve prolapse)    Osteoporosis    bilateral hips   Thyroid nodule 05/06/16   right and left thyroid nodule     Past Surgical History:  Procedure Laterality Date   back fusion  2004, 1990   lumbar   BREAST BIOPSY Left    TUBAL LIGATION     BTSP   VAGINAL HYSTERECTOMY  1980's   Prolapsa    Social History   Socioeconomic History   Marital status: Married    Spouse name: Not on file   Number of children: Not on file   Years of education: Not on file   Highest education level: Master's degree (e.g., MA, MS, MEng, MEd, MSW, MBA)  Occupational History   Not on file  Tobacco Use   Smoking status: Former    Types: Cigars    Quit date: 05/09/1973    Years since quitting: 50.5   Smokeless tobacco: Never   Tobacco comments:    In college a little  Vaping Use   Vaping status: Never Used  Substance and Sexual Activity   Alcohol use: Yes    Alcohol/week: 7.0 standard drinks of alcohol    Types: 7 Glasses of wine per week    Comment: a  glass of wine at night, occ beer   Drug use: No   Sexual activity: Not Currently    Partners: Male    Birth control/protection: Post-menopausal, Surgical    Comment: TVH  Other Topics Concern   Not on file  Social History Narrative   Not on file   Social Determinants of Health   Financial Resource Strain: Low Risk  (11/10/2023)   Overall Financial Resource Strain (CARDIA)    Difficulty of Paying Living Expenses: Not hard at all  Food Insecurity: No Food Insecurity (11/10/2023)   Hunger Vital Sign    Worried About Running Out of Food in the Last Year: Never true    Ran Out of Food in the Last Year: Never true  Transportation Needs: No Transportation Needs (11/10/2023)   PRAPARE - Scientist, research (physical sciences) (Medical): No    Lack of Transportation (Non-Medical): No  Physical Activity: Insufficiently Active (11/10/2023)   Exercise Vital Sign    Days of Exercise per Week: 3 days    Minutes of Exercise per Session: 30 min  Stress: No Stress Concern Present (11/10/2023)   [employer] of Occupational Health - Occupational Stress Questionnaire    Feeling of Stress : Not at all  Social Connections: Socially Integrated (11/10/2023)   Social Connection and Isolation Panel [NHANES]    Frequency of Communication with Friends and Family: More than three times a week    Frequency of Social Gatherings with Friends and Family: Twice a week    Attends Religious Services: More than 4 times per year    Active Member of [employer] or Organizations: Yes    Attends Engineer, structural: More than 4 times per year    Marital Status: Married  Catering manager Violence: Not At Risk (08/27/2023)   Humiliation, Afraid, Rape, and Kick questionnaire    Fear of Current or Ex-Partner: No    Emotionally Abused: No    Physically Abused: No    Sexually Abused: No    Family History  Problem Relation Age of Onset   Stroke Mother    Thyroid disease Sister    Hypertension Maternal Aunt    Breast cancer Niece 43   Breast cancer Niece      Review of Systems  Constitutional: Negative.  Negative for chills and fever.  HENT:  Negative for congestion and sore throat.   Respiratory: Negative.  Negative for cough and shortness of breath.   Cardiovascular: Negative.   Gastrointestinal:  Negative for abdominal pain, diarrhea, nausea and vomiting.  Genitourinary:  Positive for dysuria, frequency and urgency. Negative for hematuria.  Skin: Negative.   All other systems reviewed and are negative.   Today's Vitals   11/10/23 1400  BP: 130/74  Pulse: 73  Temp: 97.9 F (36.6 C)  TempSrc: Oral  SpO2: 98%  Weight: 110 lb (49.9 kg)  Height: 5\' 2"  (1.575 m)   Body mass index is 20.12  kg/m.   Physical Exam Vitals reviewed.  Constitutional:      Appearance: Normal appearance.  HENT:     Head: Normocephalic.  Eyes:     Extraocular Movements: Extraocular movements intact.     Pupils: Pupils are equal, round, and reactive to light.  Cardiovascular:     Rate and Rhythm: Normal rate.  Pulmonary:     Effort: Pulmonary effort is normal.  Abdominal:     Palpations: Abdomen is soft.     Tenderness: There is no abdominal tenderness. There is  no right CVA tenderness or left CVA tenderness.  Musculoskeletal:     Right lower leg: No edema.     Left lower leg: No edema.  Skin:    General: Skin is warm and dry.     Capillary Refill: Capillary refill takes less than 2 seconds.  Neurological:     General: No focal deficit present.     Mental Status: She is alert and oriented to person, place, and time.  Psychiatric:        Mood and Affect: Mood normal.        Behavior: Behavior normal.    Results for orders placed or performed in visit on 11/10/23 (from the past 24 hour(s))  POCT Urinalysis Dipstick     Status: Abnormal   Collection Time: 11/10/23  2:26 PM  Result Value Ref Range   Color, UA yellow    Clarity, UA cloudy    Glucose, UA Negative Negative   Bilirubin, UA negative    Ketones, UA negative    Spec Grav, UA 1.015 1.010 - 1.025   Blood, UA positive    pH, UA 7.5 5.0 - 8.0   Protein, UA Positive (A) Negative   Urobilinogen, UA 0.2 0.2 or 1.0 E.U./dL   Nitrite, UA positive    Leukocytes, UA Moderate (2+) (A) Negative   Appearance     Odor       ASSESSMENT & PLAN: A total of 32 minutes was spent with the patient and counseling/coordination of care regarding preparing for this visit, review of most recent office visit notes, review of chronic medical conditions under management, review of all medications, diagnosis of urinary tract infection and need to start antibiotics, prognosis, ED precautions, documentation and need for follow-up.  Problem List  Items Addressed This Visit       Genitourinary   Acute UTI - Primary    Positive urinalysis.  Urine sent for culture. Recommend to start Ceftin 250 mg twice a day for 7 days Advised to rest and stay well-hydrated ED precautions given. Advised to contact the office if no better or worse during the next several days.      Relevant Medications   cefUROXime (CEFTIN) 250 MG tablet     Other   Dysuria    Advised to stay well-hydrated May take over-the-counter Azo for symptomatic relief Start antibiotic today Contact the office if no better or worse during the next several days      Relevant Orders   POCT Urinalysis Dipstick (Completed)   Urine Culture   Patient Instructions  Urinary Tract Infection, Adult A urinary tract infection (UTI) is an infection of any part of the urinary tract. The urinary tract includes: The kidneys. The ureters. The bladder. The urethra. These organs make, store, and get rid of pee (urine) in the body. What are the causes? This infection is caused by germs (bacteria) in your genital area. These germs grow and cause swelling (inflammation) of your urinary tract. What increases the risk? The following factors may make you more likely to develop this condition: Using a small, thin tube (catheter) to drain pee. Not being able to control when you pee or poop (incontinence). Being female. If you are female, these things can increase the risk: Using these methods to prevent pregnancy: A medicine that kills sperm (spermicide). A device that blocks sperm (diaphragm). Having low levels of a female hormone (estrogen). Being pregnant. You are more likely to develop this condition if: You have genes that add  to your risk. You are sexually active. You take antibiotic medicines. You have trouble peeing because of: A prostate that is bigger than normal, if you are female. A blockage in the part of your body that drains pee from the bladder. A kidney stone. A  nerve condition that affects your bladder. Not getting enough to drink. Not peeing often enough. You have other conditions, such as: Diabetes. A weak disease-fighting system (immune system). Sickle cell disease. Gout. Injury of the spine. What are the signs or symptoms? Symptoms of this condition include: Needing to pee right away. Peeing small amounts often. Pain or burning when peeing. Blood in the pee. Pee that smells bad or not like normal. Trouble peeing. Pee that is cloudy. Fluid coming from the vagina, if you are female. Pain in the belly or lower back. Other symptoms include: Vomiting. Not feeling hungry. Feeling mixed up (confused). This may be the first symptom in older adults. Being tired and grouchy (irritable). A fever. Watery poop (diarrhea). How is this treated? Taking antibiotic medicine. Taking other medicines. Drinking enough water. In some cases, you may need to see a specialist. Follow these instructions at home:  Medicines Take over-the-counter and prescription medicines only as told by your doctor. If you were prescribed an antibiotic medicine, take it as told by your doctor. Do not stop taking it even if you start to feel better. General instructions Make sure you: Pee until your bladder is empty. Do not hold pee for a long time. Empty your bladder after sex. Wipe from front to back after peeing or pooping if you are a female. Use each tissue one time when you wipe. Drink enough fluid to keep your pee pale yellow. Keep all follow-up visits. Contact a doctor if: You do not get better after 1-2 days. Your symptoms go away and then come back. Get help right away if: You have very bad back pain. You have very bad pain in your lower belly. You have a fever. You have chills. You feeling like you will vomit or you vomit. Summary A urinary tract infection (UTI) is an infection of any part of the urinary tract. This condition is caused by germs in  your genital area. There are many risk factors for a UTI. Treatment includes antibiotic medicines. Drink enough fluid to keep your pee pale yellow. This information is not intended to replace advice given to you by your health care provider. Make sure you discuss any questions you have with your health care provider. Document Revised: 07/22/2020 Document Reviewed: 07/27/2020 Elsevier Patient Education  2024 Elsevier Inc.    Edwina Barth, MD [facility]

## 2023-11-10 NOTE — Patient Instructions (Signed)
Urinary Tract Infection, Adult A urinary tract infection (UTI) is an infection of any part of the urinary tract. The urinary tract includes: The kidneys. The ureters. The bladder. The urethra. These organs make, store, and get rid of pee (urine) in the body. What are the causes? This infection is caused by germs (bacteria) in your genital area. These germs grow and cause swelling (inflammation) of your urinary tract. What increases the risk? The following factors may make you more likely to develop this condition: Using a small, thin tube (catheter) to drain pee. Not being able to control when you pee or poop (incontinence). Being female. If you are female, these things can increase the risk: Using these methods to prevent pregnancy: A medicine that kills sperm (spermicide). A device that blocks sperm (diaphragm). Having low levels of a female hormone (estrogen). Being pregnant. You are more likely to develop this condition if: You have genes that add to your risk. You are sexually active. You take antibiotic medicines. You have trouble peeing because of: A prostate that is bigger than normal, if you are female. A blockage in the part of your body that drains pee from the bladder. A kidney stone. A nerve condition that affects your bladder. Not getting enough to drink. Not peeing often enough. You have other conditions, such as: Diabetes. A weak disease-fighting system (immune system). Sickle cell disease. Gout. Injury of the spine. What are the signs or symptoms? Symptoms of this condition include: Needing to pee right away. Peeing small amounts often. Pain or burning when peeing. Blood in the pee. Pee that smells bad or not like normal. Trouble peeing. Pee that is cloudy. Fluid coming from the vagina, if you are female. Pain in the belly or lower back. Other symptoms include: Vomiting. Not feeling hungry. Feeling mixed up (confused). This may be the first symptom in  older adults. Being tired and grouchy (irritable). A fever. Watery poop (diarrhea). How is this treated? Taking antibiotic medicine. Taking other medicines. Drinking enough water. In some cases, you may need to see a specialist. Follow these instructions at home:  Medicines Take over-the-counter and prescription medicines only as told by your doctor. If you were prescribed an antibiotic medicine, take it as told by your doctor. Do not stop taking it even if you start to feel better. General instructions Make sure you: Pee until your bladder is empty. Do not hold pee for a long time. Empty your bladder after sex. Wipe from front to back after peeing or pooping if you are a female. Use each tissue one time when you wipe. Drink enough fluid to keep your pee pale yellow. Keep all follow-up visits. Contact a doctor if: You do not get better after 1-2 days. Your symptoms go away and then come back. Get help right away if: You have very bad back pain. You have very bad pain in your lower belly. You have a fever. You have chills. You feeling like you will vomit or you vomit. Summary A urinary tract infection (UTI) is an infection of any part of the urinary tract. This condition is caused by germs in your genital area. There are many risk factors for a UTI. Treatment includes antibiotic medicines. Drink enough fluid to keep your pee pale yellow. This information is not intended to replace advice given to you by your health care provider. Make sure you discuss any questions you have with your health care provider. Document Revised: 07/22/2020 Document Reviewed: 07/27/2020 Elsevier Patient Education    2024 Elsevier Inc.  

## 2023-11-10 NOTE — Assessment & Plan Note (Signed)
Advised to stay well-hydrated May take over-the-counter Azo for symptomatic relief Start antibiotic today Contact the office if no better or worse during the next several days

## 2023-11-10 NOTE — Assessment & Plan Note (Signed)
Positive urinalysis.  Urine sent for culture. Recommend to start Ceftin 250 mg twice a day for 7 days Advised to rest and stay well-hydrated ED precautions given. Advised to contact the office if no better or worse during the next several days.

## 2023-11-12 LAB — URINE CULTURE

## 2023-11-30 ENCOUNTER — Ambulatory Visit: Payer: Medicare Other | Admitting: Obstetrics and Gynecology

## 2023-11-30 NOTE — Progress Notes (Signed)
GYNECOLOGY  VISIT   HPI: 77 y.o.   Married  Caucasian female   [id number] with Patient's last menstrual period was 12/30/1983.   here for: pessary check. Pt does want to discuss reoccurring UTI's.  Feeling a little twinge in her bladder.  Maybe feels pressure.   She is concerned because she was taking calcium when she was taking Ciprofloxacin.   E Coli UTI 12/03/23.  Treated with Ciprofloxacin. Stopped about 5 days ago.  E Coli UTI 11/10/23.  Treated with Ceftin.   She took her pessary out and she got the UTI following this.   Not currently sexually active.   Using vaginal estrogen cream but not to the urethra.  She needs a refill.   GYNECOLOGIC HISTORY: Patient's last menstrual period was 12/30/1983. Contraception: hyst Menopausal hormone therapy:  estrace Last 2 paps:  years ago History of abnormal Pap or positive HPV:  no Mammogram:  12/01/23 Breast Density Cat B, BI-RADS CAT 1 neg - [facility]        OB History     Gravida  3   Para  2   Term      Preterm      AB  1   Living  1      SAB      IAB  1   Ectopic      Multiple      Live Births                 Patient Active Problem List   Diagnosis Date Noted   Recent urinary tract infection 12/03/2023   Acute UTI 11/10/2023   Dyslipidemia, goal LDL below 130 04/16/2023   Dysuria 02/24/2023   Chronic heart failure with preserved ejection fraction (HCC) 02/06/2023   Grade 1 out of 6 intensity murmur 01/12/2023   Primary hypertension 01/12/2023   Stage 3b chronic kidney disease (HCC) 01/12/2023   Basal cell carcinoma of nose 09/30/2022   Need for influenza vaccination 12/19/2016   Ganglion cyst of right foot 04/25/2016   Low bone mass 04/25/2016   Thyroid nodule 04/25/2016   Gastro-esophageal reflux disease with esophagitis 04/24/2016   Osteoarthritis 04/24/2016    Past Medical History:  Diagnosis Date   Cystocele 06/15/00   Hypertension    MVP (mitral valve prolapse)    Osteoporosis     bilateral hips   Thyroid nodule 05/06/16   right and left thyroid nodule     Past Surgical History:  Procedure Laterality Date   back fusion  2004, 1990   lumbar   BREAST BIOPSY Left    TUBAL LIGATION     BTSP   VAGINAL HYSTERECTOMY  1980's   Prolapsa    Current Outpatient Medications  Medication Sig Dispense Refill   B Complex Vitamins (VITAMIN B COMPLEX) TABS Take 1 tablet by mouth daily.     calcium carbonate (SUPER CALCIUM) 1500 (600 Ca) MG TABS tablet Take 1 tablet by mouth daily.     cholecalciferol 25 MCG (1000 UT) tablet Take 1,000 Units by mouth.     losartan (COZAAR) 100 MG tablet Take 1 tablet (100 mg total) by mouth daily. 90 tablet 3   loteprednol (LOTEMAX) 0.5 % ophthalmic suspension as needed.     rosuvastatin (CRESTOR) 5 MG tablet Take 1 tablet (5 mg total) by mouth daily. 90 tablet 0   estradiol (ESTRACE) 0.1 MG/GM vaginal cream Use 1/2 g vaginally three to four times per week.   Also place a pea  size amount to the urethra 3 - 4 times per week. 42.5 g 0   No current facility-administered medications for this visit.     ALLERGIES: Thimerosal (thiomersal)  Family History  Problem Relation Age of Onset   Stroke Mother    Thyroid disease Sister    Hypertension Maternal Aunt    Breast cancer Niece 20   Breast cancer Niece     Social History   Socioeconomic History   Marital status: Married    Spouse name: Not on file   Number of children: Not on file   Years of education: Not on file   Highest education level: Master's degree (e.g., MA, MS, MEng, MEd, MSW, MBA)  Occupational History   Not on file  Tobacco Use   Smoking status: Former    Types: Cigars    Quit date: 05/09/1973    Years since quitting: 50.6   Smokeless tobacco: Never   Tobacco comments:    In college a little  Vaping Use   Vaping status: Never Used  Substance and Sexual Activity   Alcohol use: Yes    Alcohol/week: 7.0 standard drinks of alcohol    Types: 7 Glasses of wine per week     Comment: a glass of wine at night, occ beer   Drug use: No   Sexual activity: Not Currently    Partners: Male    Birth control/protection: Post-menopausal, Surgical    Comment: TVH  Other Topics Concern   Not on file  Social History Narrative   Not on file   Social Drivers of Health   Financial Resource Strain: Low Risk  (11/10/2023)   Overall Financial Resource Strain (CARDIA)    Difficulty of Paying Living Expenses: Not hard at all  Food Insecurity: No Food Insecurity (11/10/2023)   Hunger Vital Sign    Worried About Running Out of Food in the Last Year: Never true    Ran Out of Food in the Last Year: Never true  Transportation Needs: No Transportation Needs (11/10/2023)   PRAPARE - Administrator, Civil Service (Medical): No    Lack of Transportation (Non-Medical): No  Physical Activity: Insufficiently Active (11/10/2023)   Exercise Vital Sign    Days of Exercise per Week: 3 days    Minutes of Exercise per Session: 30 min  Stress: No Stress Concern Present (11/10/2023)   [employer] of Occupational Health - Occupational Stress Questionnaire    Feeling of Stress : Not at all  Social Connections: Socially Integrated (11/10/2023)   Social Connection and Isolation Panel [NHANES]    Frequency of Communication with Friends and Family: More than three times a week    Frequency of Social Gatherings with Friends and Family: Twice a week    Attends Religious Services: More than 4 times per year    Active Member of [employer] or Organizations: Yes    Attends Banker Meetings: More than 4 times per year    Marital Status: Married  Catering manager Violence: Not At Risk (08/27/2023)   Humiliation, Afraid, Rape, and Kick questionnaire    Fear of Current or Ex-Partner: No    Emotionally Abused: No    Physically Abused: No    Sexually Abused: No    Review of Systems  All other systems reviewed and are negative.   PHYSICAL EXAMINATION:   BP 112/64 (BP  Location: Left Arm, Patient Position: Sitting, Cuff Size: Small)   Pulse 78   Ht 5\' 2"  (1.575  m)   Wt 107 lb (48.5 kg)   LMP 12/30/1983   SpO2 99%   BMI 19.57 kg/m     General appearance: alert, cooperative and appears stated age   Pelvic: External genitalia:  no lesions              Urethra:  normal appearing urethra with no masses, tenderness or lesions              Bartholins and Skenes: normal                 Vagina: normal appearing vagina with normal color and discharge, posterior vagina with shallow ulceration.               Cervix: no lesions                Bimanual Exam:  Uterus:  normal size, contour, position, consistency, mobility, non-tender              Adnexa: no mass, fullness, tenderness    Chaperone was present for exam:  Warren Lacy, CMA  ASSESSMENT:  Pelvic organ prolapse.   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   Pessary maintenance.  Vaginal ulceration.  Bladder pressure.  Recent UTIs. Encounter for medication monitoring.   PLAN:  Urine culture sent.  Not enough urine to do urinalysis and reflex culture.  Keep pessary out for one at least one week.  Refill of estradiol cream.  Place 1/2 gram vaginally 3 - 4 times a week along with a pea size amount to urethra. FU 2 months and prn.   25 min  total time was spent for this patient encounter, including preparation, face-to-face counseling with the patient, coordination of care, and documentation of the encounter.

## 2023-12-01 LAB — HM MAMMOGRAPHY

## 2023-12-02 ENCOUNTER — Ambulatory Visit: Payer: Medicare Other | Admitting: Internal Medicine

## 2023-12-02 ENCOUNTER — Ambulatory Visit: Payer: Medicare Other | Admitting: Obstetrics and Gynecology

## 2023-12-03 ENCOUNTER — Encounter: Payer: Self-pay | Admitting: Emergency Medicine

## 2023-12-03 ENCOUNTER — Encounter: Payer: Self-pay | Admitting: Obstetrics and Gynecology

## 2023-12-03 ENCOUNTER — Ambulatory Visit (INDEPENDENT_AMBULATORY_CARE_PROVIDER_SITE_OTHER): Payer: Medicare Other | Admitting: Emergency Medicine

## 2023-12-03 VITALS — BP 130/70 | HR 77 | Temp 98.2°F | Ht 62.0 in | Wt 108.4 lb

## 2023-12-03 DIAGNOSIS — R300 Dysuria: Secondary | ICD-10-CM

## 2023-12-03 DIAGNOSIS — Z87440 Personal history of urinary (tract) infections: Secondary | ICD-10-CM | POA: Diagnosis not present

## 2023-12-03 LAB — POCT URINALYSIS DIP (MANUAL ENTRY)
Bilirubin, UA: NEGATIVE
Glucose, UA: NEGATIVE mg/dL
Ketones, POC UA: NEGATIVE mg/dL
Nitrite, UA: POSITIVE — AB
Spec Grav, UA: 1.02 (ref 1.010–1.025)
Urobilinogen, UA: 0.2 U/dL
pH, UA: 6.5 (ref 5.0–8.0)

## 2023-12-03 MED ORDER — CIPROFLOXACIN HCL 500 MG PO TABS: 500.0000 mg | ORAL_TABLET | Freq: Two times a day (BID) | ORAL | 0 refills | Status: AC

## 2023-12-03 NOTE — Assessment & Plan Note (Signed)
With history of recent E. coli urinary tract infection with multidrug sensitivities Finished complete course of cephalosporin Urinalysis done today.  Urine sent for culture. Will start Cipro 500 mg twice a day for 7 days Pyelonephritis precautions given. Advised to stay well-hydrated

## 2023-12-03 NOTE — Assessment & Plan Note (Signed)
E. coli sensitive to all antibiotics tested. Recently completed course of Ceftin Recommend to start Cipro 500 mg twice a day for 7 days Urine sent for culture today.

## 2023-12-03 NOTE — Progress Notes (Signed)
Tammy Bowen 77 y.o.   Chief Complaint  Patient presents with   Dysuria    Started last Saturday.     HISTORY OF PRESENT ILLNESS: This is a 76 y.o. female complaining of burning on urination that started last week and Seen by me on 11/10/2023 with similar symptoms and found to have E. coli urinary tract infection Took Ceftin 250 mg twice a day for 7 days Denies nausea or vomiting.  Denies fever.  Denies flank pain or gross hematuria. No other associated symptoms No other complaints or medical concerns today.  Dysuria  Associated symptoms include frequency. Pertinent negatives include no chills, flank pain, hematuria, nausea or vomiting.     Prior to Admission medications   Medication Sig Start Date End Date Taking? Authorizing Provider  B Complex Vitamins (VITAMIN B COMPLEX) TABS Take 1 tablet by mouth daily.   Yes [provider]  calcium carbonate (SUPER CALCIUM) 1500 (600 Ca) MG TABS tablet Take 1 tablet by mouth daily. 04/24/16  Yes [provider]  cholecalciferol 25 MCG (1000 UT) tablet Take 1,000 Units by mouth. 08/21/22  Yes [provider]  estradiol (ESTRACE) 0.1 MG/GM vaginal cream Use 1/2 g vaginally three to four times per week 09/21/23  Yes Tammy Shirley Friar, MD  losartan (COZAAR) 100 MG tablet Take 1 tablet (100 mg total) by mouth daily. 03/18/23 03/12/24 Yes [place], Peter M, MD  loteprednol (LOTEMAX) 0.5 % ophthalmic suspension as needed. 07/22/16  Yes [provider]  rosuvastatin (CRESTOR) 5 MG tablet Take 1 tablet (5 mg total) by mouth daily. 10/06/23  Yes Tammy Grandchild, MD    Allergies  Allergen Reactions   Thimerosal (Thiomersal) Swelling    Patient Active Problem List   Diagnosis Date Noted   Acute UTI 11/10/2023   Dyslipidemia, goal LDL below 130 04/16/2023   Dysuria 02/24/2023   Chronic heart failure with preserved ejection fraction (HCC) 02/06/2023   Grade 1 out of 6 intensity murmur 01/12/2023    Primary hypertension 01/12/2023   Stage 3b chronic kidney disease (HCC) 01/12/2023   Basal cell carcinoma of nose 09/30/2022   Need for influenza vaccination 12/19/2016   Ganglion cyst of right foot 04/25/2016   Low bone mass 04/25/2016   Thyroid nodule 04/25/2016   Gastro-esophageal reflux disease with esophagitis 04/24/2016   Osteoarthritis 04/24/2016    Past Medical History:  Diagnosis Date   Cystocele 06/15/00   Hypertension    MVP (mitral valve prolapse)    Osteoporosis    bilateral hips   Thyroid nodule 05/06/16   right and left thyroid nodule     Past Surgical History:  Procedure Laterality Date   back fusion  2004, 1990   lumbar   BREAST BIOPSY Left    TUBAL LIGATION     BTSP   VAGINAL HYSTERECTOMY  1980's   Prolapsa    Social History   Socioeconomic History   Marital status: Married    Spouse name: Not on file   Number of children: Not on file   Years of education: Not on file   Highest education level: Master's degree (e.g., MA, MS, MEng, MEd, MSW, MBA)  Occupational History   Not on file  Tobacco Use   Smoking status: Former    Types: Cigars    Quit date: 05/09/1973    Years since quitting: 50.6   Smokeless tobacco: Never   Tobacco comments:    In college a little  Vaping Use  Vaping status: Never Used  Substance and Sexual Activity   Alcohol use: Yes    Alcohol/week: 7.0 standard drinks of alcohol    Types: 7 Glasses of wine per week    Comment: a glass of wine at night, occ beer   Drug use: No   Sexual activity: Not Currently    Partners: Male    Birth control/protection: Post-menopausal, Surgical    Comment: TVH  Other Topics Concern   Not on file  Social History Narrative   Not on file   Social Determinants of Health   Financial Resource Strain: Low Risk  (11/10/2023)   Overall Financial Resource Strain (CARDIA)    Difficulty of Paying Living Expenses: Not hard at all  Food Insecurity: No Food Insecurity (11/10/2023)   Hunger Vital  Sign    Worried About Running Out of Food in the Last Year: Never true    Ran Out of Food in the Last Year: Never true  Transportation Needs: No Transportation Needs (11/10/2023)   PRAPARE - Administrator, Civil Service (Medical): No    Lack of Transportation (Non-Medical): No  Physical Activity: Insufficiently Active (11/10/2023)   Exercise Vital Sign    Days of Exercise per Week: 3 days    Minutes of Exercise per Session: 30 min  Stress: No Stress Concern Present (11/10/2023)   [employer] of Occupational Health - Occupational Stress Questionnaire    Feeling of Stress : Not at all  Social Connections: Socially Integrated (11/10/2023)   Social Connection and Isolation Panel [NHANES]    Frequency of Communication with Friends and Family: More than three times a week    Frequency of Social Gatherings with Friends and Family: Twice a week    Attends Religious Services: More than 4 times per year    Active Member of [employer] or Organizations: Yes    Attends Engineer, structural: More than 4 times per year    Marital Status: Married  Catering manager Violence: Not At Risk (08/27/2023)   Humiliation, Afraid, Rape, and Kick questionnaire    Fear of Current or Ex-Partner: No    Emotionally Abused: No    Physically Abused: No    Sexually Abused: No    Family History  Problem Relation Age of Onset   Stroke Mother    Thyroid disease Sister    Hypertension Maternal Aunt    Breast cancer Niece 77   Breast cancer Niece      Review of Systems  Constitutional:  Negative for chills and fever.  HENT:  Negative for congestion and sore throat.   Respiratory:  Negative for shortness of breath.   Gastrointestinal:  Negative for abdominal pain, nausea and vomiting.  Genitourinary:  Positive for dysuria and frequency. Negative for flank pain and hematuria.  Neurological:  Negative for dizziness and headaches.  All other systems reviewed and are negative.   Vitals:    12/03/23 1401  BP: 130/70  Pulse: 77  Temp: 98.2 F (36.8 C)  SpO2: 99%    Physical Exam Vitals reviewed.  Constitutional:      Appearance: Normal appearance.  HENT:     Head: Normocephalic.  Eyes:     Extraocular Movements: Extraocular movements intact.  Cardiovascular:     Rate and Rhythm: Normal rate.  Pulmonary:     Effort: Pulmonary effort is normal.  Abdominal:     Palpations: Abdomen is soft.     Tenderness: There is no abdominal tenderness. There is no  right CVA tenderness or left CVA tenderness.  Skin:    General: Skin is warm and dry.     Capillary Refill: Capillary refill takes less than 2 seconds.  Neurological:     Mental Status: She is alert and oriented to person, place, and time.  Psychiatric:        Mood and Affect: Mood normal.        Behavior: Behavior normal.    Results for orders placed or performed in visit on 12/03/23 (from the past 24 hour(s))  POCT urinalysis dipstick     Status: Abnormal   Collection Time: 12/03/23  2:24 PM  Result Value Ref Range   Color, UA light yellow (A) yellow   Clarity, UA cloudy (A) clear   Glucose, UA negative negative mg/dL   Bilirubin, UA negative negative   Ketones, POC UA negative negative mg/dL   Spec Grav, UA 1.610 9.604 - 1.025   Blood, UA trace-lysed (A) negative   pH, UA 6.5 5.0 - 8.0   Protein Ur, POC trace (A) negative mg/dL   Urobilinogen, UA 0.2 0.2 or 1.0 E.U./dL   Nitrite, UA Positive (A) Negative   Leukocytes, UA Large (3+) (A) Negative     ASSESSMENT & PLAN: A total of 32 minutes was spent with the patient and counseling/coordination of care regarding preparing for this visit, review of most recent office visit notes, review of most recent urine culture report, presence of urinary tract infection and need to restart antibiotics, prognosis, documentation, and need for follow-up if no better or worse during the next several days.  Problem List Items Addressed This Visit       Other   Dysuria  - Primary    With history of recent E. coli urinary tract infection with multidrug sensitivities Finished complete course of cephalosporin Urinalysis done today.  Urine sent for culture. Will start Cipro 500 mg twice a day for 7 days Pyelonephritis precautions given. Advised to stay well-hydrated      Relevant Medications   ciprofloxacin (CIPRO) 500 MG tablet   Other Relevant Orders   POCT urinalysis dipstick   Urine Culture   Recent urinary tract infection    E. coli sensitive to all antibiotics tested. Recently completed course of Ceftin Recommend to start Cipro 500 mg twice a day for 7 days Urine sent for culture today.      Relevant Medications   ciprofloxacin (CIPRO) 500 MG tablet   Other Relevant Orders   POCT urinalysis dipstick   Urine Culture   Patient Instructions  Urinary Tract Infection, Adult A urinary tract infection (UTI) is an infection of any part of the urinary tract. The urinary tract includes: The kidneys. The ureters. The bladder. The urethra. These organs make, store, and get rid of pee (urine) in the body. What are the causes? This infection is caused by germs (bacteria) in your genital area. These germs grow and cause swelling (inflammation) of your urinary tract. What increases the risk? The following factors may make you more likely to develop this condition: Using a small, thin tube (catheter) to drain pee. Not being able to control when you pee or poop (incontinence). Being female. If you are female, these things can increase the risk: Using these methods to prevent pregnancy: A medicine that kills sperm (spermicide). A device that blocks sperm (diaphragm). Having low levels of a female hormone (estrogen). Being pregnant. You are more likely to develop this condition if: You have genes that add to your  risk. You are sexually active. You take antibiotic medicines. You have trouble peeing because of: A prostate that is bigger than normal,  if you are female. A blockage in the part of your body that drains pee from the bladder. A kidney stone. A nerve condition that affects your bladder. Not getting enough to drink. Not peeing often enough. You have other conditions, such as: Diabetes. A weak disease-fighting system (immune system). Sickle cell disease. Gout. Injury of the spine. What are the signs or symptoms? Symptoms of this condition include: Needing to pee right away. Peeing small amounts often. Pain or burning when peeing. Blood in the pee. Pee that smells bad or not like normal. Trouble peeing. Pee that is cloudy. Fluid coming from the vagina, if you are female. Pain in the belly or lower back. Other symptoms include: Vomiting. Not feeling hungry. Feeling mixed up (confused). This may be the first symptom in older adults. Being tired and grouchy (irritable). A fever. Watery poop (diarrhea). How is this treated? Taking antibiotic medicine. Taking other medicines. Drinking enough water. In some cases, you may need to see a specialist. Follow these instructions at home:  Medicines Take over-the-counter and prescription medicines only as told by your doctor. If you were prescribed an antibiotic medicine, take it as told by your doctor. Do not stop taking it even if you start to feel better. General instructions Make sure you: Pee until your bladder is empty. Do not hold pee for a long time. Empty your bladder after sex. Wipe from front to back after peeing or pooping if you are a female. Use each tissue one time when you wipe. Drink enough fluid to keep your pee pale yellow. Keep all follow-up visits. Contact a doctor if: You do not get better after 1-2 days. Your symptoms go away and then come back. Get help right away if: You have very bad back pain. You have very bad pain in your lower belly. You have a fever. You have chills. You feeling like you will vomit or you vomit. Summary A urinary  tract infection (UTI) is an infection of any part of the urinary tract. This condition is caused by germs in your genital area. There are many risk factors for a UTI. Treatment includes antibiotic medicines. Drink enough fluid to keep your pee pale yellow. This information is not intended to replace advice given to you by your health care provider. Make sure you discuss any questions you have with your health care provider. Document Revised: 07/22/2020 Document Reviewed: 07/27/2020 Elsevier Patient Education  2024 Elsevier Inc.    Edwina Barth, MD [facility]

## 2023-12-03 NOTE — Patient Instructions (Signed)
Urinary Tract Infection, Adult A urinary tract infection (UTI) is an infection of any part of the urinary tract. The urinary tract includes: The kidneys. The ureters. The bladder. The urethra. These organs make, store, and get rid of pee (urine) in the body. What are the causes? This infection is caused by germs (bacteria) in your genital area. These germs grow and cause swelling (inflammation) of your urinary tract. What increases the risk? The following factors may make you more likely to develop this condition: Using a small, thin tube (catheter) to drain pee. Not being able to control when you pee or poop (incontinence). Being female. If you are female, these things can increase the risk: Using these methods to prevent pregnancy: A medicine that kills sperm (spermicide). A device that blocks sperm (diaphragm). Having low levels of a female hormone (estrogen). Being pregnant. You are more likely to develop this condition if: You have genes that add to your risk. You are sexually active. You take antibiotic medicines. You have trouble peeing because of: A prostate that is bigger than normal, if you are female. A blockage in the part of your body that drains pee from the bladder. A kidney stone. A nerve condition that affects your bladder. Not getting enough to drink. Not peeing often enough. You have other conditions, such as: Diabetes. A weak disease-fighting system (immune system). Sickle cell disease. Gout. Injury of the spine. What are the signs or symptoms? Symptoms of this condition include: Needing to pee right away. Peeing small amounts often. Pain or burning when peeing. Blood in the pee. Pee that smells bad or not like normal. Trouble peeing. Pee that is cloudy. Fluid coming from the vagina, if you are female. Pain in the belly or lower back. Other symptoms include: Vomiting. Not feeling hungry. Feeling mixed up (confused). This may be the first symptom in  older adults. Being tired and grouchy (irritable). A fever. Watery poop (diarrhea). How is this treated? Taking antibiotic medicine. Taking other medicines. Drinking enough water. In some cases, you may need to see a specialist. Follow these instructions at home:  Medicines Take over-the-counter and prescription medicines only as told by your doctor. If you were prescribed an antibiotic medicine, take it as told by your doctor. Do not stop taking it even if you start to feel better. General instructions Make sure you: Pee until your bladder is empty. Do not hold pee for a long time. Empty your bladder after sex. Wipe from front to back after peeing or pooping if you are a female. Use each tissue one time when you wipe. Drink enough fluid to keep your pee pale yellow. Keep all follow-up visits. Contact a doctor if: You do not get better after 1-2 days. Your symptoms go away and then come back. Get help right away if: You have very bad back pain. You have very bad pain in your lower belly. You have a fever. You have chills. You feeling like you will vomit or you vomit. Summary A urinary tract infection (UTI) is an infection of any part of the urinary tract. This condition is caused by germs in your genital area. There are many risk factors for a UTI. Treatment includes antibiotic medicines. Drink enough fluid to keep your pee pale yellow. This information is not intended to replace advice given to you by your health care provider. Make sure you discuss any questions you have with your health care provider. Document Revised: 07/22/2020 Document Reviewed: 07/27/2020 Elsevier Patient Education    2024 Elsevier Inc.  

## 2023-12-06 LAB — URINE CULTURE

## 2023-12-14 ENCOUNTER — Encounter: Payer: Self-pay | Admitting: Obstetrics and Gynecology

## 2023-12-14 ENCOUNTER — Ambulatory Visit (INDEPENDENT_AMBULATORY_CARE_PROVIDER_SITE_OTHER): Payer: Medicare Other | Admitting: Obstetrics and Gynecology

## 2023-12-14 VITALS — BP 112/64 | HR 78 | Ht 62.0 in | Wt 107.0 lb

## 2023-12-14 DIAGNOSIS — Z87440 Personal history of urinary (tract) infections: Secondary | ICD-10-CM

## 2023-12-14 DIAGNOSIS — R3989 Other symptoms and signs involving the genitourinary system: Secondary | ICD-10-CM

## 2023-12-14 DIAGNOSIS — Z4689 Encounter for fitting and adjustment of other specified devices: Secondary | ICD-10-CM

## 2023-12-14 DIAGNOSIS — N765 Ulceration of vagina: Secondary | ICD-10-CM | POA: Diagnosis not present

## 2023-12-14 DIAGNOSIS — N819 Female genital prolapse, unspecified: Secondary | ICD-10-CM | POA: Diagnosis not present

## 2023-12-14 DIAGNOSIS — Z5181 Encounter for therapeutic drug level monitoring: Secondary | ICD-10-CM

## 2023-12-14 MED ORDER — ESTRADIOL 0.1 MG/GM VA CREA: TOPICAL_CREAM | VAGINAL | 0 refills | Status: DC

## 2023-12-16 LAB — URINE CULTURE
MICRO NUMBER:: 15860456
Result:: NO GROWTH
SPECIMEN QUALITY:: ADEQUATE

## 2023-12-30 DIAGNOSIS — K222 Esophageal obstruction: Secondary | ICD-10-CM

## 2023-12-30 HISTORY — DX: Esophageal obstruction: K22.2

## 2023-12-30 HISTORY — PX: ESOPHAGEAL DILATION: SHX303

## 2024-01-07 ENCOUNTER — Encounter: Payer: Self-pay | Admitting: Internal Medicine

## 2024-01-07 ENCOUNTER — Ambulatory Visit (INDEPENDENT_AMBULATORY_CARE_PROVIDER_SITE_OTHER): Payer: Medicare Other | Admitting: Internal Medicine

## 2024-01-07 VITALS — BP 136/80 | HR 82 | Temp 97.7°F | Resp 16 | Ht 62.0 in | Wt 107.0 lb

## 2024-01-07 DIAGNOSIS — R0609 Other forms of dyspnea: Secondary | ICD-10-CM | POA: Insufficient documentation

## 2024-01-07 DIAGNOSIS — I10 Essential (primary) hypertension: Secondary | ICD-10-CM | POA: Diagnosis not present

## 2024-01-07 DIAGNOSIS — E785 Hyperlipidemia, unspecified: Secondary | ICD-10-CM

## 2024-01-07 LAB — BRAIN NATRIURETIC PEPTIDE: Pro B Natriuretic peptide (BNP): 22 pg/mL (ref 0.0–100.0)

## 2024-01-07 LAB — TROPONIN I (HIGH SENSITIVITY): High Sens Troponin I: 6 ng/L (ref 2–17)

## 2024-01-07 LAB — BASIC METABOLIC PANEL
BUN: 33 mg/dL — ABNORMAL HIGH (ref 6–23)
CO2: 32 meq/L (ref 19–32)
Calcium: 9.6 mg/dL (ref 8.4–10.5)
Chloride: 101 meq/L (ref 96–112)
Creatinine, Ser: 0.87 mg/dL (ref 0.40–1.20)
GFR: 64.36 mL/min (ref >60.00)
Glucose, Bld: 105 mg/dL — ABNORMAL HIGH (ref 70–99)
Potassium: 4.7 meq/L (ref 3.5–5.1)
Sodium: 140 meq/L (ref 135–145)

## 2024-01-07 LAB — HEPATIC FUNCTION PANEL
ALT: 19 U/L (ref 0–35)
AST: 25 U/L (ref 0–37)
Albumin: 4.4 g/dL (ref 3.5–5.2)
Alkaline Phosphatase: 61 U/L (ref 39–117)
Bilirubin, Direct: 0.1 mg/dL (ref 0.0–0.3)
Total Bilirubin: 0.4 mg/dL (ref 0.2–1.2)
Total Protein: 8 g/dL (ref 6.0–8.3)

## 2024-01-07 LAB — TSH: TSH: 3.1 u[IU]/mL (ref 0.35–5.50)

## 2024-01-07 NOTE — Progress Notes (Signed)
 Subjective:  Patient ID: Tammy Bowen, female    DOB: 11/06/1946  Age: 78 y.o. MRN: [id number]  CC: Hypertension and Hyperlipidemia   HPI AMYE GREGO presents for f/up ----  Discussed the use of AI scribe software for clinical note transcription with the patient, who gave verbal consent to proceed.  History of Present Illness   The patient, with a history of breast arterial calcification, presents with concerns about potential coronary artery calcification. She reports occasional shortness of breath, particularly when walking uphill, but denies any associated chest pain or cold sweats. She is currently on blood pressure medication and a statin, with no reported side effects such as muscle aches or dizziness.  In addition, the patient has a history of a thyroid  nodule that was previously sampled and has not recurred. She also has a history of basal cell carcinoma for which she underwent radiation therapy, but recent concerns about the appearance of the treated area led to a biopsy.  The patient denies taking baby aspirin due to excessive bleeding. She is open to further testing to assess her coronary arteries and thyroid  function.       Outpatient Medications Prior to Visit  Medication Sig Dispense Refill   B Complex Vitamins (VITAMIN B COMPLEX) TABS Take 1 tablet by mouth daily.     calcium  carbonate (SUPER CALCIUM ) 1500 (600 Ca) MG TABS tablet Take 1 tablet by mouth daily.     cholecalciferol 25 MCG (1000 UT) tablet Take 1,000 Units by mouth.     estradiol  (ESTRACE ) 0.1 MG/GM vaginal cream Use 1/2 g vaginally three to four times per week.   Also place a pea size amount to the urethra 3 - 4 times per week. 42.5 g 0   losartan  (COZAAR ) 100 MG tablet Take 1 tablet (100 mg total) by mouth daily. 90 tablet 3   loteprednol (LOTEMAX) 0.5 % ophthalmic suspension as needed.     rosuvastatin  (CRESTOR ) 5 MG tablet Take 1 tablet (5 mg total) by mouth daily. 90 tablet 0   No  facility-administered medications prior to visit.    ROS Review of Systems  Constitutional: Negative.  Negative for diaphoresis and fatigue.  HENT: Negative.    Eyes: Negative.   Respiratory:  Positive for shortness of breath (DOE). Negative for cough, chest tightness and wheezing.   Cardiovascular:  Negative for chest pain, palpitations and leg swelling.  Gastrointestinal:  Negative for abdominal pain, constipation, diarrhea, nausea and vomiting.  Genitourinary: Negative.  Negative for difficulty urinating.  Musculoskeletal: Negative.  Negative for arthralgias and myalgias.  Skin: Negative.   Neurological:  Negative for dizziness, weakness and light-headedness.  Hematological:  Negative for adenopathy. Does not bruise/bleed easily.  Psychiatric/Behavioral: Negative.      Objective:  BP 136/80 (BP Location: Left Arm, Patient Position: Sitting, Cuff Size: Small)   Pulse 82   Temp 97.7 F (36.5 C) (Oral)   Resp 16   Ht 5' 2 (1.575 m)   Wt 107 lb (48.5 kg)   LMP 12/30/1983   SpO2 98%   BMI 19.57 kg/m   BP Readings from Last 3 Encounters:  01/07/24 136/80  12/14/23 112/64  12/03/23 130/70    Wt Readings from Last 3 Encounters:  01/07/24 107 lb (48.5 kg)  12/14/23 107 lb (48.5 kg)  12/03/23 108 lb 6.4 oz (49.2 kg)    Physical Exam Vitals reviewed.  HENT:     Mouth/Throat:     Mouth: Mucous membranes are moist.  Eyes:  General: No scleral icterus.    Conjunctiva/sclera: Conjunctivae normal.  Neck:     Thyroid : No thyroid  mass, thyromegaly or thyroid  tenderness.  Cardiovascular:     Rate and Rhythm: Normal rate and regular rhythm.     Heart sounds: Murmur heard.     No friction rub. No gallop.  Pulmonary:     Effort: Pulmonary effort is normal.     Breath sounds: No stridor. No wheezing, rhonchi or rales.  Abdominal:     General: Abdomen is flat.     Palpations: There is no mass.     Tenderness: There is no abdominal tenderness. There is no guarding.      Hernia: No hernia is present.  Musculoskeletal:        General: Normal range of motion.     Cervical back: Neck supple.     Right lower leg: No edema.     Left lower leg: No edema.  Lymphadenopathy:     Cervical: No cervical adenopathy.  Skin:    General: Skin is warm and dry.  Neurological:     General: No focal deficit present.     Mental Status: She is alert. Mental status is at baseline.  Psychiatric:        Mood and Affect: Mood normal.        Behavior: Behavior normal.     Lab Results  Component Value Date   WBC 4.0 09/02/2023   HGB 12.3 09/02/2023   HCT 37.9 09/02/2023   PLT 247.0 09/02/2023   GLUCOSE 105 (H) 01/07/2024   CHOL 175 09/02/2023   TRIG 98.0 09/02/2023   HDL 63.30 09/02/2023   LDLCALC 92 09/02/2023   ALT 19 01/07/2024   AST 25 01/07/2024   NA 140 01/07/2024   K 4.7 01/07/2024   CL 101 01/07/2024   CREATININE 0.87 01/07/2024   BUN 33 (H) 01/07/2024   CO2 32 01/07/2024   TSH 3.10 01/07/2024    No results found.  Assessment & Plan:   Dyslipidemia, goal LDL below 130 - LDL goal achieved. Doing well on the statin  -     TSH; Future -     Hepatic function panel; Future  Primary hypertension- Her BP is well controlled. -     TSH; Future -     Hepatic function panel; Future -     Basic metabolic panel; Future  DOE (dyspnea on exertion)- Will risk stratify with a CCS. -     Troponin I (High Sensitivity); Future -     Brain natriuretic peptide; Future -     CT CARDIAC SCORING (SELF PAY ONLY); Future     Follow-up: Return in about 6 months (around 07/06/2024).  Debby Molt, MD

## 2024-01-07 NOTE — Patient Instructions (Signed)
 Hypertension, Adult High blood pressure (hypertension) is when the force of blood pumping through the arteries is too strong. The arteries are the blood vessels that carry blood from the heart throughout the body. Hypertension forces the heart to work harder to pump blood and may cause arteries to become narrow or stiff. Untreated or uncontrolled hypertension can lead to a heart attack, heart failure, a stroke, kidney disease, and other problems. A blood pressure reading consists of a higher number over a lower number. Ideally, your blood pressure should be below 120/80. The first ("top") number is called the systolic pressure. It is a measure of the pressure in your arteries as your heart beats. The second ("bottom") number is called the diastolic pressure. It is a measure of the pressure in your arteries as the heart relaxes. What are the causes? The exact cause of this condition is not known. There are some conditions that result in high blood pressure. What increases the risk? Certain factors may make you more likely to develop high blood pressure. Some of these risk factors are under your control, including: Smoking. Not getting enough exercise or physical activity. Being overweight. Having too much fat, sugar, calories, or salt (sodium) in your diet. Drinking too much alcohol. Other risk factors include: Having a personal history of heart disease, diabetes, high cholesterol, or kidney disease. Stress. Having a family history of high blood pressure and high cholesterol. Having obstructive sleep apnea. Age. The risk increases with age. What are the signs or symptoms? High blood pressure may not cause symptoms. Very high blood pressure (hypertensive crisis) may cause: Headache. Fast or irregular heartbeats (palpitations). Shortness of breath. Nosebleed. Nausea and vomiting. Vision changes. Severe chest pain, dizziness, and seizures. How is this diagnosed? This condition is diagnosed by  measuring your blood pressure while you are seated, with your arm resting on a flat surface, your legs uncrossed, and your feet flat on the floor. The cuff of the blood pressure monitor will be placed directly against the skin of your upper arm at the level of your heart. Blood pressure should be measured at least twice using the same arm. Certain conditions can cause a difference in blood pressure between your right and left arms. If you have a high blood pressure reading during one visit or you have normal blood pressure with other risk factors, you may be asked to: Return on a different day to have your blood pressure checked again. Monitor your blood pressure at home for 1 week or longer. If you are diagnosed with hypertension, you may have other blood or imaging tests to help your health care provider understand your overall risk for other conditions. How is this treated? This condition is treated by making healthy lifestyle changes, such as eating healthy foods, exercising more, and reducing your alcohol intake. You may be referred for counseling on a healthy diet and physical activity. Your health care provider may prescribe medicine if lifestyle changes are not enough to get your blood pressure under control and if: Your systolic blood pressure is above 130. Your diastolic blood pressure is above 80. Your personal target blood pressure may vary depending on your medical conditions, your age, and other factors. Follow these instructions at home: Eating and drinking  Eat a diet that is high in fiber and potassium, and low in sodium, added sugar, and fat. An example of this eating plan is called the DASH diet. DASH stands for Dietary Approaches to Stop Hypertension. To eat this way: Eat  plenty of fresh fruits and vegetables. Try to fill one half of your plate at each meal with fruits and vegetables. Eat whole grains, such as whole-wheat pasta, brown rice, or whole-grain bread. Fill about one  fourth of your plate with whole grains. Eat or drink low-fat dairy products, such as skim milk or low-fat yogurt. Avoid fatty cuts of meat, processed or cured meats, and poultry with skin. Fill about one fourth of your plate with lean proteins, such as fish, chicken without skin, beans, eggs, or tofu. Avoid pre-made and processed foods. These tend to be higher in sodium, added sugar, and fat. Reduce your daily sodium intake. Many people with hypertension should eat less than 1,500 mg of sodium a day. Do not drink alcohol if: Your health care provider tells you not to drink. You are pregnant, may be pregnant, or are planning to become pregnant. If you drink alcohol: Limit how much you have to: 0-1 drink a day for women. 0-2 drinks a day for men. Know how much alcohol is in your drink. In the [place] one drink equals one 12 oz bottle of beer (355 mL), one 5 oz glass of wine (148 mL), or one 1 oz glass of hard liquor (44 mL). Lifestyle  Work with your health care provider to maintain a healthy body weight or to lose weight. Ask what an ideal weight is for you. Get at least 30 minutes of exercise that causes your heart to beat faster (aerobic exercise) most days of the week. Activities may include walking, swimming, or biking. Include exercise to strengthen your muscles (resistance exercise), such as Pilates or lifting weights, as part of your weekly exercise routine. Try to do these types of exercises for 30 minutes at least 3 days a week. Do not use any products that contain nicotine or tobacco. These products include cigarettes, chewing tobacco, and vaping devices, such as e-cigarettes. If you need help quitting, ask your health care provider. Monitor your blood pressure at home as told by your health care provider. Keep all follow-up visits. This is important. Medicines Take over-the-counter and prescription medicines only as told by your health care provider. Follow directions carefully. Blood  pressure medicines must be taken as prescribed. Do not skip doses of blood pressure medicine. Doing this puts you at risk for problems and can make the medicine less effective. Ask your health care provider about side effects or reactions to medicines that you should watch for. Contact a health care provider if you: Think you are having a reaction to a medicine you are taking. Have headaches that keep coming back (recurring). Feel dizzy. Have swelling in your ankles. Have trouble with your vision. Get help right away if you: Develop a severe headache or confusion. Have unusual weakness or numbness. Feel faint. Have severe pain in your chest or abdomen. Vomit repeatedly. Have trouble breathing. These symptoms may be an emergency. Get help right away. Call 911. Do not wait to see if the symptoms will go away. Do not drive yourself to the hospital. Summary Hypertension is when the force of blood pumping through your arteries is too strong. If this condition is not controlled, it may put you at risk for serious complications. Your personal target blood pressure may vary depending on your medical conditions, your age, and other factors. For most people, a normal blood pressure is less than 120/80. Hypertension is treated with lifestyle changes, medicines, or a combination of both. Lifestyle changes include losing weight, eating a healthy,  low-sodium diet, exercising more, and limiting alcohol. This information is not intended to replace advice given to you by your health care provider. Make sure you discuss any questions you have with your health care provider. Document Revised: 10/22/2021 Document Reviewed: 10/22/2021 Elsevier Patient Education  2024 [employer].

## 2024-01-15 ENCOUNTER — Other Ambulatory Visit: Payer: Self-pay | Admitting: Internal Medicine

## 2024-01-15 DIAGNOSIS — E785 Hyperlipidemia, unspecified: Secondary | ICD-10-CM

## 2024-02-02 ENCOUNTER — Ambulatory Visit
Admission: RE | Admit: 2024-02-02 | Discharge: 2024-02-02 | Disposition: A | Discharge status: Home / Self Care | Payer: No Typology Code available for payment source | Source: Ambulatory Visit | Attending: Internal Medicine | Admitting: Internal Medicine

## 2024-02-02 DIAGNOSIS — R0609 Other forms of dyspnea: Secondary | ICD-10-CM

## 2024-02-02 NOTE — Progress Notes (Signed)
 GYNECOLOGY  VISIT   HPI: 78 y.o.   Married  Caucasian female   G3P0011 with Patient's last menstrual period was 12/30/1983.   here for: 2 mo pessary check.  Has tried various pessaries.  The larger pessaries that hold better are causing abrasion, ulceration.  Smaller pessaries don't stay in as well.   She is currently using an incontinence dish. It holds the prolapse better for her than the ring with support.  Able to get the cream in better and able to remove the pessary better.  States she has almost no incontinence.  However, she feels her prolapse is progressing more.    No bleeding or vaginal discharge.   She wants to know if the pessary is rubbing on an organ.   No discomfort, unless the prolapse is coming out beyond the pessary.   Her son is a physician, neurology.      GYNECOLOGIC HISTORY: Patient's last menstrual period was 12/30/1983. Contraception:  hyst Menopausal hormone therapy:  estrace Last 2 paps:  years ago History of abnormal Pap or positive HPV:  no Mammogram:  12/01/23 Breast Density Cat B, BI-RADS CAT 1 neg - [facility]         OB History     Gravida  3   Para  2   Term      Preterm      AB  1   Living  1      SAB      IAB  1   Ectopic      Multiple      Live Births                 Patient Active Problem List   Diagnosis Date Noted   DOE (dyspnea on exertion) 01/07/2024   Dyslipidemia, goal LDL below 130 04/16/2023   Chronic heart failure with preserved ejection fraction (HCC) 02/06/2023   Grade 1 out of 6 intensity murmur 01/12/2023   Primary hypertension 01/12/2023   Stage 3b chronic kidney disease (HCC) 01/12/2023   Basal cell carcinoma of nose 09/30/2022   Need for influenza vaccination 12/19/2016   Ganglion cyst of right foot 04/25/2016   Low bone mass 04/25/2016   Thyroid nodule 04/25/2016   Gastro-esophageal reflux disease with esophagitis 04/24/2016   Osteoarthritis 04/24/2016    Past Medical History:   Diagnosis Date   Cystocele 06/15/00   Hypertension    MVP (mitral valve prolapse)    Osteoporosis    bilateral hips   Thyroid nodule 05/06/16   right and left thyroid nodule     Past Surgical History:  Procedure Laterality Date   back fusion  2004, 1990   lumbar   BREAST BIOPSY Left    TUBAL LIGATION     BTSP   VAGINAL HYSTERECTOMY  1980's   Prolapsa    Current Outpatient Medications  Medication Sig Dispense Refill   B Complex Vitamins (VITAMIN B COMPLEX) TABS Take 1 tablet by mouth daily.     calcium carbonate (SUPER CALCIUM) 1500 (600 Ca) MG TABS tablet Take 1 tablet by mouth daily.     cholecalciferol 25 MCG (1000 UT) tablet Take 1,000 Units by mouth.     estradiol (ESTRACE) 0.1 MG/GM vaginal cream Use 1/2 g vaginally three to four times per week.   Also place a pea size amount to the urethra 3 - 4 times per week. 42.5 g 0   losartan (COZAAR) 100 MG tablet Take 1 tablet (100 mg total)  by mouth daily. 90 tablet 3   loteprednol (LOTEMAX) 0.5 % ophthalmic suspension as needed.     rosuvastatin (CRESTOR) 5 MG tablet Take 1 tablet (5 mg total) by mouth daily. 90 tablet 1   No current facility-administered medications for this visit.     ALLERGIES: Thimerosal (thiomersal)  Family History  Problem Relation Age of Onset   Stroke Mother    Thyroid disease Sister    Hypertension Maternal Aunt    Breast cancer Niece 13   Breast cancer Niece     Social History   Socioeconomic History   Marital status: Married    Spouse name: Not on file   Number of children: Not on file   Years of education: Not on file   Highest education level: Master's degree (e.g., MA, MS, MEng, MEd, MSW, MBA)  Occupational History   Not on file  Tobacco Use   Smoking status: Former    Types: Cigars    Quit date: 05/09/1973    Years since quitting: 50.8   Smokeless tobacco: Never   Tobacco comments:    In college a little  Vaping Use   Vaping status: Never Used  Substance and Sexual Activity    Alcohol use: Yes    Alcohol/week: 7.0 standard drinks of alcohol    Types: 7 Glasses of wine per week    Comment: a glass of wine at night, occ beer   Drug use: No   Sexual activity: Not Currently    Partners: Male    Birth control/protection: Post-menopausal, Surgical    Comment: TVH  Other Topics Concern   Not on file  Social History Narrative   Not on file   Social Drivers of Health   Financial Resource Strain: Low Risk  (01/04/2024)   Overall Financial Resource Strain (CARDIA)    Difficulty of Paying Living Expenses: Not hard at all  Food Insecurity: No Food Insecurity (01/04/2024)   Hunger Vital Sign    Worried About Running Out of Food in the Last Year: Never true    Ran Out of Food in the Last Year: Never true  Transportation Needs: No Transportation Needs (01/04/2024)   PRAPARE - Administrator, Civil Service (Medical): No    Lack of Transportation (Non-Medical): No  Physical Activity: Insufficiently Active (01/04/2024)   Exercise Vital Sign    Days of Exercise per Week: 2 days    Minutes of Exercise per Session: 30 min  Stress: No Stress Concern Present (01/04/2024)   [employer] of Occupational Health - Occupational Stress Questionnaire    Feeling of Stress : Not at all  Social Connections: Socially Integrated (01/04/2024)   Social Connection and Isolation Panel [NHANES]    Frequency of Communication with Friends and Family: More than three times a week    Frequency of Social Gatherings with Friends and Family: Twice a week    Attends Religious Services: More than 4 times per year    Active Member of [employer] or Organizations: Yes    Attends Banker Meetings: More than 4 times per year    Marital Status: Married  Catering manager Violence: Not At Risk (08/27/2023)   Humiliation, Afraid, Rape, and Kick questionnaire    Fear of Current or Ex-Partner: No    Emotionally Abused: No    Physically Abused: No    Sexually Abused: No    Review of  Systems  All other systems reviewed and are negative.   PHYSICAL EXAMINATION:  BP (!) 112/54 (BP Location: Left Arm, Patient Position: Sitting, Cuff Size: Small)   Pulse 84   Ht 5\' 2"  (1.575 m)   Wt 107 lb (48.5 kg)   LMP 12/30/1983   SpO2 96%   BMI 19.57 kg/m     General appearance: alert, cooperative and appears stated age Head: Normocephalic, without obvious abnormality, atraumatic Neck: no adenopathy, supple, symmetrical, trachea midline and thyroid normal to inspection and palpation Lungs: clear to auscultation bilaterally Breasts: normal appearance, no masses or tenderness, No nipple retraction or dimpling, No nipple discharge or bleeding, No axillary or supraclavicular adenopathy Heart: regular rate and rhythm Abdomen: soft, non-tender, no masses,  no organomegaly Extremities: extremities normal, atraumatic, no cyanosis or edema Skin: Skin color, texture, turgor normal. No rashes or lesions Lymph nodes: Cervical, supraclavicular, and axillary nodes normal. No abnormal inguinal nodes palpated Neurologic: Grossly normal  Pelvic: External genitalia:  no lesions              Urethra:  normal appearing urethra with no masses, tenderness or lesions              Bartholins and Skenes: normal                 Vagina: normal appearing vagina with normal color and discharge, minor abrasion of right vaginal apex.  Minimal erythema of the left vaginal apex.  Third degree cystocele, first degree apical prolapse, good posterior support.               Cervix:  absent.                Bimanual Exam:  Uterus:  absent.              Adnexa: no mass, fullness, tenderness              Rectal exam: Yes.  .  Confirms.              Anus:  normal sphincter tone, no lesions  Chaperone was present for exam:  Warren Lacy, CMA  ASSESSMENT:  Status post hysterectomy.  Pelvic organ prolapse.   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   Pessary maintenance.   Vaginal abrasion.  Stress incontinence.  Improved with incontinence dish.  Recent UTIs.  PLAN:  Continue current pessary use.  Continue vaginal estrogen use.  Refill given.  We discussed potential surgical correction as an alternative treatment to pessary use.  Referral to urogynecology surgeon.  Fu here in 6 weeks.    35 min  total time was spent for this patient encounter, including preparation, face-to-face counseling with the patient, coordination of care, and documentation of the encounter.

## 2024-02-03 ENCOUNTER — Encounter: Payer: Self-pay | Admitting: Internal Medicine

## 2024-02-16 ENCOUNTER — Encounter: Payer: Self-pay | Admitting: Obstetrics and Gynecology

## 2024-02-16 ENCOUNTER — Telehealth: Payer: Self-pay | Admitting: Obstetrics and Gynecology

## 2024-02-16 ENCOUNTER — Ambulatory Visit (INDEPENDENT_AMBULATORY_CARE_PROVIDER_SITE_OTHER): Payer: Medicare Other | Admitting: Obstetrics and Gynecology

## 2024-02-16 VITALS — BP 112/54 | HR 84 | Ht 62.0 in | Wt 107.0 lb

## 2024-02-16 DIAGNOSIS — N8110 Cystocele, unspecified: Secondary | ICD-10-CM | POA: Diagnosis not present

## 2024-02-16 DIAGNOSIS — N393 Stress incontinence (female) (male): Secondary | ICD-10-CM

## 2024-02-16 MED ORDER — ESTRADIOL 0.1 MG/GM VA CREA: TOPICAL_CREAM | VAGINAL | 0 refills | Status: DC

## 2024-02-16 NOTE — Telephone Encounter (Signed)
Referral order placed.   Routing to [employer].   Encounter closed.

## 2024-02-16 NOTE — Telephone Encounter (Signed)
 Please make a referral to urogynecology, Dr. Florian Buff or Dr. Almedia Balls.   My patient has vaginal prolapse and urinary stress incontinence.

## 2024-03-01 ENCOUNTER — Ambulatory Visit: Payer: Medicare Other | Admitting: Internal Medicine

## 2024-03-15 NOTE — Progress Notes (Signed)
 GYNECOLOGY  VISIT   HPI: 78 y.o.   Married  Caucasian female   G3P0011 with Patient's last menstrual period was 12/30/1983.   here for: pessary check- pt changed to larger pessary yesterday and it starts to come out on its own.    She is removing the pessary once or twice a day as it is starting to come out.  She is pleased to be able to place and remove it.   Yesterday or day before had a little bleeding.   No other bleeding for the last several months.   No pain or discomfort.   Using applicator successfully with estrogen cream.   Has appointment with urogynecology in June, 2025.  Still would like to avoid surgery if she can successfully use a pessary.  She will still keep this appointment.   GYNECOLOGIC HISTORY: Patient's last menstrual period was 12/30/1983. Contraception:  hyst Menopausal hormone therapy:  estrace Last 2 paps:  years ago History of abnormal Pap or positive HPV:  no Mammogram:  12/01/23 Breast Density Cat B, BI-RADS CAT 1 neg - [facility]         OB History     Gravida  3   Para  2   Term      Preterm      AB  1   Living  1      SAB      IAB  1   Ectopic      Multiple      Live Births                 Patient Active Problem List   Diagnosis Date Noted   DOE (dyspnea on exertion) 01/07/2024   Dyslipidemia, goal LDL below 130 04/16/2023   Chronic heart failure with preserved ejection fraction (HCC) 02/06/2023   Grade 1 out of 6 intensity murmur 01/12/2023   Primary hypertension 01/12/2023   Stage 3b chronic kidney disease (HCC) 01/12/2023   Basal cell carcinoma of nose 09/30/2022   Need for influenza vaccination 12/19/2016   Ganglion cyst of right foot 04/25/2016   Low bone mass 04/25/2016   Thyroid nodule 04/25/2016   Gastro-esophageal reflux disease with esophagitis 04/24/2016   Osteoarthritis 04/24/2016    Past Medical History:  Diagnosis Date   Cystocele 06/15/00   Hypertension    MVP (mitral valve prolapse)     Osteoporosis    bilateral hips   Thyroid nodule 05/06/16   right and left thyroid nodule     Past Surgical History:  Procedure Laterality Date   back fusion  2004, 1990   lumbar   BREAST BIOPSY Left    TUBAL LIGATION     BTSP   VAGINAL HYSTERECTOMY  1980's   Prolapsa    Current Outpatient Medications  Medication Sig Dispense Refill   B Complex Vitamins (VITAMIN B COMPLEX) TABS Take 1 tablet by mouth daily.     calcium carbonate (SUPER CALCIUM) 1500 (600 Ca) MG TABS tablet Take 1 tablet by mouth daily.     cholecalciferol 25 MCG (1000 UT) tablet Take 1,000 Units by mouth.     estradiol (ESTRACE) 0.1 MG/GM vaginal cream Use 1/2 g vaginally three to four times per week.   Also place a pea size amount to the urethra 3 - 4 times per week. 42.5 g 0   loteprednol (LOTEMAX) 0.5 % ophthalmic suspension as needed.     rosuvastatin (CRESTOR) 5 MG tablet Take 1 tablet (5 mg total) by  mouth daily. 90 tablet 1   losartan (COZAAR) 100 MG tablet Take 1 tablet (100 mg total) by mouth daily. 90 tablet 3   No current facility-administered medications for this visit.     ALLERGIES: Thimerosal (thiomersal)  Family History  Problem Relation Age of Onset   Stroke Mother    Thyroid disease Sister    Hypertension Maternal Aunt    Breast cancer Niece 91   Breast cancer Niece     Social History   Socioeconomic History   Marital status: Married    Spouse name: Not on file   Number of children: Not on file   Years of education: Not on file   Highest education level: Master's degree (e.g., MA, MS, MEng, MEd, MSW, MBA)  Occupational History   Not on file  Tobacco Use   Smoking status: Former    Types: Cigars    Quit date: 05/09/1973    Years since quitting: 50.9   Smokeless tobacco: Never   Tobacco comments:    In college a little  Vaping Use   Vaping status: Never Used  Substance and Sexual Activity   Alcohol use: Yes    Alcohol/week: 7.0 standard drinks of alcohol    Types: 7 Glasses  of wine per week    Comment: a glass of wine at night, occ beer   Drug use: No   Sexual activity: Not Currently    Partners: Male    Birth control/protection: Post-menopausal, Surgical    Comment: TVH  Other Topics Concern   Not on file  Social History Narrative   Not on file   Social Drivers of Health   Financial Resource Strain: Low Risk  (01/04/2024)   Overall Financial Resource Strain (CARDIA)    Difficulty of Paying Living Expenses: Not hard at all  Food Insecurity: No Food Insecurity (01/04/2024)   Hunger Vital Sign    Worried About Running Out of Food in the Last Year: Never true    Ran Out of Food in the Last Year: Never true  Transportation Needs: No Transportation Needs (01/04/2024)   PRAPARE - Administrator, Civil Service (Medical): No    Lack of Transportation (Non-Medical): No  Physical Activity: Insufficiently Active (01/04/2024)   Exercise Vital Sign    Days of Exercise per Week: 2 days    Minutes of Exercise per Session: 30 min  Stress: No Stress Concern Present (01/04/2024)   [employer] of Occupational Health - Occupational Stress Questionnaire    Feeling of Stress : Not at all  Social Connections: Socially Integrated (01/04/2024)   Social Connection and Isolation Panel [NHANES]    Frequency of Communication with Friends and Family: More than three times a week    Frequency of Social Gatherings with Friends and Family: Twice a week    Attends Religious Services: More than 4 times per year    Active Member of [employer] or Organizations: Yes    Attends Banker Meetings: More than 4 times per year    Marital Status: Married  Catering manager Violence: Not At Risk (08/27/2023)   Humiliation, Afraid, Rape, and Kick questionnaire    Fear of Current or Ex-Partner: No    Emotionally Abused: No    Physically Abused: No    Sexually Abused: No    Review of Systems  All other systems reviewed and are negative.   PHYSICAL EXAMINATION:   BP  122/70 (BP Location: Left Arm, Patient Position: Sitting, Cuff Size: Small)  Pulse 87   Ht 5\' 2"  (1.575 m)   Wt 108 lb (49 kg)   LMP 12/30/1983   SpO2 98%   BMI 19.75 kg/m     General appearance: alert, cooperative and appears stated age   Pelvic: External genitalia:  no lesions              Urethra:  normal appearing urethra with no masses, tenderness or lesions              Bartholins and Skenes: normal                 Vagina: bladder is prolapsing beyond the #4 incontinence dish.  Small abrasion to the right vaginal apex.                Cervix: absent                Bimanual Exam:  Uterus:  absent              Adnexa: no mass, fullness, tenderness     #4 incontinence dish removed, cleansed, and replaced by the patient at the end of the visit.   # 5 incontinence dish fitted with demo kit.  Holds her cystocele better then #4 incontinence dish.  It was removed.     Chaperone was present for exam:  Warren Lacy, CMA  ASSESSMENT:  Status post hysterectomy.  Pelvic organ prolapse.   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   Pessary maintenance.  Vaginal abrasion.  Stress incontinence.  Improved with incontinence dish.   PLAN:  #5 incontinence dish fitted.  Patient able to place and remove. Comfortable and able to walk and maintain pessary.  She understands that this may cause more abrasion of the vaginal mucosa due to the increased size.  Patient desires to have this ordered for her.  She will use her #4 incontinence dish until the new pessary arrives. Continue vaginal estrogen use.  She will continue to keep her urogynecology appointment.   35 min  total time was spent for this patient encounter, including preparation, face-to-face counseling with the patient, coordination of care, and documentation of the encounter.

## 2024-03-29 ENCOUNTER — Ambulatory Visit (INDEPENDENT_AMBULATORY_CARE_PROVIDER_SITE_OTHER): Payer: Medicare Other | Admitting: Obstetrics and Gynecology

## 2024-03-29 ENCOUNTER — Telehealth: Payer: Self-pay | Admitting: Obstetrics and Gynecology

## 2024-03-29 ENCOUNTER — Encounter: Payer: Self-pay | Admitting: Obstetrics and Gynecology

## 2024-03-29 VITALS — BP 122/70 | HR 87 | Ht 62.0 in | Wt 108.0 lb

## 2024-03-29 DIAGNOSIS — Z4689 Encounter for fitting and adjustment of other specified devices: Secondary | ICD-10-CM | POA: Diagnosis not present

## 2024-03-29 DIAGNOSIS — N8110 Cystocele, unspecified: Secondary | ICD-10-CM

## 2024-03-29 NOTE — Telephone Encounter (Signed)
 Please order a #5 incontinence dish for my patient.

## 2024-03-30 NOTE — Telephone Encounter (Signed)
 Pessary ordered.  Estimated arrival 2-4 weeks.

## 2024-04-06 ENCOUNTER — Other Ambulatory Visit: Payer: Self-pay | Admitting: Cardiology

## 2024-04-14 NOTE — Telephone Encounter (Signed)
 Pessary received in office.   Spoke with patient. OV scheduled for 05/04/24 at 1500 for pessary fitting.   Routing to provider for final review. Patient is agreeable to disposition. Will close encounter.

## 2024-05-04 ENCOUNTER — Encounter: Payer: Self-pay | Admitting: Obstetrics and Gynecology

## 2024-05-04 ENCOUNTER — Ambulatory Visit (INDEPENDENT_AMBULATORY_CARE_PROVIDER_SITE_OTHER): Admitting: Obstetrics and Gynecology

## 2024-05-04 VITALS — BP 118/70 | HR 73 | Ht 62.0 in | Wt 108.0 lb

## 2024-05-04 DIAGNOSIS — N393 Stress incontinence (female) (male): Secondary | ICD-10-CM

## 2024-05-04 DIAGNOSIS — N8110 Cystocele, unspecified: Secondary | ICD-10-CM

## 2024-05-04 MED ORDER — ESTRADIOL 0.1 MG/GM VA CREA: TOPICAL_CREAM | VAGINAL | Status: DC

## 2024-05-04 NOTE — Progress Notes (Signed)
 GYNECOLOGY  VISIT   HPI: 78 y.o.   Married  Caucasian female   G3P0011 with Patient's last menstrual period was 12/30/1983.   here for: Pessary Fitting  Her current pessary is coming out twice a day.    Patient fitted for a #5 incontinence dish on 03/30/23.       GYNECOLOGIC HISTORY: Patient's last menstrual period was 12/30/1983. Contraception:  Hysterectomy  Menopausal hormone therapy:  Estrace  Last 2 paps:  Years ago History of abnormal Pap or positive HPV:  no Mammogram:  12/01/23 Breast Density Cat B, BIRADS Cat 1 neg         OB History     Gravida  3   Para  2   Term      Preterm      AB  1   Living  1      SAB      IAB  1   Ectopic      Multiple      Live Births                 Patient Active Problem List   Diagnosis Date Noted   DOE (dyspnea on exertion) 01/07/2024   Dyslipidemia, goal LDL below 130 04/16/2023   Chronic heart failure with preserved ejection fraction (HCC) 02/06/2023   Grade 1 out of 6 intensity murmur 01/12/2023   Primary hypertension 01/12/2023   Stage 3b chronic kidney disease (HCC) 01/12/2023   Basal cell carcinoma of nose 09/30/2022   Need for influenza vaccination 12/19/2016   Ganglion cyst of right foot 04/25/2016   Low bone mass 04/25/2016   Thyroid  nodule 04/25/2016   Gastro-esophageal reflux disease with esophagitis 04/24/2016   Osteoarthritis 04/24/2016    Past Medical History:  Diagnosis Date   Cystocele 06/15/00   Hypertension    MVP (mitral valve prolapse)    Osteoporosis    bilateral hips   Thyroid  nodule 05/06/16   right and left thyroid  nodule     Past Surgical History:  Procedure Laterality Date   back fusion  2004, 1990   lumbar   BREAST BIOPSY Left    TUBAL LIGATION     BTSP   VAGINAL HYSTERECTOMY  1980's   Prolapsa    Current Outpatient Medications  Medication Sig Dispense Refill   B Complex Vitamins (VITAMIN B COMPLEX) TABS Take 1 tablet by mouth daily.     calcium  carbonate (SUPER  CALCIUM ) 1500 (600 Ca) MG TABS tablet Take 1 tablet by mouth daily.     cholecalciferol 25 MCG (1000 UT) tablet Take 1,000 Units by mouth.     estradiol  (ESTRACE ) 0.1 MG/GM vaginal cream Use 1/2 g vaginally three to four times per week.   Also place a pea size amount to the urethra 3 - 4 times per week. 42.5 g 0   losartan  (COZAAR ) 100 MG tablet Take 1 tablet (100 mg total) by mouth daily. 90 tablet 0   loteprednol (LOTEMAX) 0.5 % ophthalmic suspension as needed.     rosuvastatin  (CRESTOR ) 5 MG tablet Take 1 tablet (5 mg total) by mouth daily. 90 tablet 1   No current facility-administered medications for this visit.     ALLERGIES: Thimerosal (thiomersal)  Family History  Problem Relation Age of Onset   Stroke Mother    Thyroid  disease Sister    Hypertension Maternal Aunt    Breast cancer Niece 20   Breast cancer Niece     Social History   Socioeconomic  History   Marital status: Married    Spouse name: Not on file   Number of children: Not on file   Years of education: Not on file   Highest education level: Master's degree (e.g., MA, MS, MEng, MEd, MSW, MBA)  Occupational History   Not on file  Tobacco Use   Smoking status: Former    Types: Cigars    Quit date: 05/09/1973    Years since quitting: 51.0   Smokeless tobacco: Never   Tobacco comments:    In college a little  Vaping Use   Vaping status: Never Used  Substance and Sexual Activity   Alcohol use: Yes    Alcohol/week: 7.0 standard drinks of alcohol    Types: 7 Glasses of wine per week    Comment: a glass of wine at night, occ beer   Drug use: No   Sexual activity: Not Currently    Partners: Male    Birth control/protection: Post-menopausal, Surgical    Comment: TVH  Other Topics Concern   Not on file  Social History Narrative   Not on file   Social Drivers of Health   Financial Resource Strain: Low Risk  (01/04/2024)   Overall Financial Resource Strain (CARDIA)    Difficulty of Paying Living Expenses:  Not hard at all  Food Insecurity: No Food Insecurity (01/04/2024)   Hunger Vital Sign    Worried About Running Out of Food in the Last Year: Never true    Ran Out of Food in the Last Year: Never true  Transportation Needs: No Transportation Needs (01/04/2024)   PRAPARE - Administrator, Civil Service (Medical): No    Lack of Transportation (Non-Medical): No  Physical Activity: Insufficiently Active (01/04/2024)   Exercise Vital Sign    Days of Exercise per Week: 2 days    Minutes of Exercise per Session: 30 min  Stress: No Stress Concern Present (01/04/2024)   [employer] of Occupational Health - Occupational Stress Questionnaire    Feeling of Stress : Not at all  Social Connections: Socially Integrated (01/04/2024)   Social Connection and Isolation Panel [NHANES]    Frequency of Communication with Friends and Family: More than three times a week    Frequency of Social Gatherings with Friends and Family: Twice a week    Attends Religious Services: More than 4 times per year    Active Member of [employer] or Organizations: Yes    Attends Banker Meetings: More than 4 times per year    Marital Status: Married  Catering manager Violence: Not At Risk (08/27/2023)   Humiliation, Afraid, Rape, and Kick questionnaire    Fear of Current or Ex-Partner: No    Emotionally Abused: No    Physically Abused: No    Sexually Abused: No    Review of Systems  All other systems reviewed and are negative.   PHYSICAL EXAMINATION:   BP 118/70 (BP Location: Left Arm, Patient Position: Sitting, Cuff Size: Normal)   Pulse 73   Ht 5\' 2"  (1.575 m)   Wt 108 lb (49 kg)   LMP 12/30/1983   SpO2 100%   BMI 19.75 kg/m     General appearance: alert, cooperative and appears stated age   Pelvic: External genitalia:  no lesions              Urethra:  normal appearing urethra with no masses, tenderness or lesions  Bartholins and Skenes: normal                 Vagina: normal  appearing vagina with normal color and discharge, no lesions.  Minor erythema of the left posterior vaginal mucosa and right right vaginal apex.               Cervix: absent                Bimanual Exam:  Uterus:  absent              Adnexa: no mass, fullness, tenderness       #4 incontinence dish removed, cleansed, and given to patient in a biobag.   #5 incontinence dish placed. Holds vaginal prolapse well.   ASSESSMENT:  Vaginal prolapse.  Stress incontinence.   PLAN:  M[employer]x Incontinence dish #5, 3-1/8", 80 mm placed vaginally.   Patient was previously able to place and remove the #5 dish in the office.   Use vaginal estradiol  cream 1 gram pv twice weekly.  FU in 2 weeks.  She will see urogyn in June.   26 min  total time was spent for this patient encounter, including preparation, face-to-face counseling with the patient, coordination of care, and documentation of the encounter.

## 2024-05-30 ENCOUNTER — Ambulatory Visit (INDEPENDENT_AMBULATORY_CARE_PROVIDER_SITE_OTHER): Admitting: Obstetrics and Gynecology

## 2024-05-30 ENCOUNTER — Encounter: Payer: Self-pay | Admitting: Obstetrics and Gynecology

## 2024-05-30 VITALS — BP 122/76 | HR 76

## 2024-05-30 DIAGNOSIS — N393 Stress incontinence (female) (male): Secondary | ICD-10-CM | POA: Diagnosis not present

## 2024-05-30 DIAGNOSIS — Z4689 Encounter for fitting and adjustment of other specified devices: Secondary | ICD-10-CM | POA: Diagnosis not present

## 2024-05-30 DIAGNOSIS — N993 Prolapse of vaginal vault after hysterectomy: Secondary | ICD-10-CM

## 2024-05-30 NOTE — Progress Notes (Signed)
 GYNECOLOGY  VISIT   HPI: 78 y.o.   Married  Caucasian female   G3P0011 with Patient's last menstrual period was 12/30/1983.   here for: Pessary follow up. States it feels better than the last one, more difficult to remove but does feel better than the last one. Estradiol  cream has been helping with removing and reinserting.   Using a 80 mm incontinence dish.   No vaginal bleeding or spotting.  No usual discharge.  No pain.   Voiding ok but generally slower, in an ongoing way.  Has some spontaneous leakage of urine.  Now having urinary incontinence if sneezes or clears her throat.   She is interested in continued pessary management.  She has an appointment with urogyn on 06/20/24.   Her son is a neurologist.   GYNECOLOGIC HISTORY: Patient's last menstrual period was 12/30/1983. Contraception:  Hysterectomy Menopausal hormone therapy:  Estrace  cream.  Last 2 paps:  Years ago History of abnormal Pap or positive HPV:  no Mammogram:  12/01/23 Breast Density Cat B, BIRADS Cat 1 neg         OB History     Gravida  3   Para  2   Term      Preterm      AB  1   Living  1      SAB      IAB  1   Ectopic      Multiple      Live Births                 Patient Active Problem List   Diagnosis Date Noted   DOE (dyspnea on exertion) 01/07/2024   Dyslipidemia, goal LDL below 130 04/16/2023   Chronic heart failure with preserved ejection fraction (HCC) 02/06/2023   Grade 1 out of 6 intensity murmur 01/12/2023   Primary hypertension 01/12/2023   Stage 3b chronic kidney disease (HCC) 01/12/2023   Basal cell carcinoma of nose 09/30/2022   Need for influenza vaccination 12/19/2016   Ganglion cyst of right foot 04/25/2016   Low bone mass 04/25/2016   Thyroid  nodule 04/25/2016   Gastro-esophageal reflux disease with esophagitis 04/24/2016   Osteoarthritis 04/24/2016    Past Medical History:  Diagnosis Date   Cystocele 06/15/00   Hypertension    MVP (mitral  valve prolapse)    Osteoporosis    bilateral hips   Thyroid  nodule 05/06/16   right and left thyroid  nodule     Past Surgical History:  Procedure Laterality Date   back fusion  2004, 1990   lumbar   BREAST BIOPSY Left    TUBAL LIGATION     BTSP   VAGINAL HYSTERECTOMY  1980's   Prolapsa    Current Outpatient Medications  Medication Sig Dispense Refill   B Complex Vitamins (VITAMIN B COMPLEX) TABS Take 1 tablet by mouth daily.     calcium  carbonate (SUPER CALCIUM ) 1500 (600 Ca) MG TABS tablet Take 1 tablet by mouth daily.     cholecalciferol 25 MCG (1000 UT) tablet Take 1,000 Units by mouth.     estradiol  (ESTRACE ) 0.1 MG/GM vaginal cream Use 1 g vaginally two times per week.   Also place a pea size amount to the urethra 3 - 4 times per week.     losartan  (COZAAR ) 100 MG tablet Take 1 tablet (100 mg total) by mouth daily. 90 tablet 0   loteprednol (LOTEMAX) 0.5 % ophthalmic suspension as needed.     rosuvastatin  (  CRESTOR ) 5 MG tablet Take 1 tablet (5 mg total) by mouth daily. 90 tablet 1   No current facility-administered medications for this visit.     ALLERGIES: Thimerosal (thiomersal)  Family History  Problem Relation Age of Onset   Stroke Mother    Thyroid  disease Sister    Hypertension Maternal Aunt    Breast cancer Niece 67   Breast cancer Niece     Social History   Socioeconomic History   Marital status: Married    Spouse name: Not on file   Number of children: Not on file   Years of education: Not on file   Highest education level: Master's degree (e.g., MA, MS, MEng, MEd, MSW, MBA)  Occupational History   Not on file  Tobacco Use   Smoking status: Former    Types: Cigars    Quit date: 05/09/1973    Years since quitting: 51.0   Smokeless tobacco: Never   Tobacco comments:    In college a little  Vaping Use   Vaping status: Never Used  Substance and Sexual Activity   Alcohol use: Yes    Alcohol/week: 7.0 standard drinks of alcohol    Types: 7 Glasses  of wine per week    Comment: a glass of wine at night, occ beer   Drug use: No   Sexual activity: Not Currently    Partners: Male    Birth control/protection: Post-menopausal, Surgical    Comment: TVH  Other Topics Concern   Not on file  Social History Narrative   Not on file   Social Drivers of Health   Financial Resource Strain: Low Risk  (01/04/2024)   Overall Financial Resource Strain (CARDIA)    Difficulty of Paying Living Expenses: Not hard at all  Food Insecurity: No Food Insecurity (01/04/2024)   Hunger Vital Sign    Worried About Running Out of Food in the Last Year: Never true    Ran Out of Food in the Last Year: Never true  Transportation Needs: No Transportation Needs (01/04/2024)   PRAPARE - Administrator, Civil Service (Medical): No    Lack of Transportation (Non-Medical): No  Physical Activity: Insufficiently Active (01/04/2024)   Exercise Vital Sign    Days of Exercise per Week: 2 days    Minutes of Exercise per Session: 30 min  Stress: No Stress Concern Present (01/04/2024)   [employer] of Occupational Health - Occupational Stress Questionnaire    Feeling of Stress : Not at all  Social Connections: Socially Integrated (01/04/2024)   Social Connection and Isolation Panel [NHANES]    Frequency of Communication with Friends and Family: More than three times a week    Frequency of Social Gatherings with Friends and Family: Twice a week    Attends Religious Services: More than 4 times per year    Active Member of [employer] or Organizations: Yes    Attends Banker Meetings: More than 4 times per year    Marital Status: Married  Catering manager Violence: Not At Risk (08/27/2023)   Humiliation, Afraid, Rape, and Kick questionnaire    Fear of Current or Ex-Partner: No    Emotionally Abused: No    Physically Abused: No    Sexually Abused: No    Review of Systems  See HPI.   PHYSICAL EXAMINATION:   BP 122/76 (BP Location: Left Arm, Patient  Position: Sitting)   Pulse 76   LMP 12/30/1983   SpO2 98%  General appearance: alert, cooperative and appears stated age  Pelvic: External genitalia:  no lesions              Urethra:  normal appearing urethra with no masses, tenderness or lesions              Bartholins and Skenes: normal                 Vagina:  mild bilateral vaginal thinning of mucosa.  Pale coloration of tissue.  No ulceration or inflammation.               Cervix: absent                Bimanual Exam:  Uterus: absent              Adnexa: no mass, fullness, tenderness             Pessary removed, cleansed, and replaced.   Chaperone was present for exam:  Cottie Diss, CMA  ASSESSMENT:  Vaginal prolapse following hysterectomy.  Pessary maintenance.  Stress incontinence.   PLAN:  We will continue with optimization of pessary management.  Return for pessary fitting.    Patient will bring her prior pessaries with her for the follow up visit.  She may need a softer pessary, ring with support, that is easier to remove. She has an appointment with urogynecology on 06/20/24.   30 mi  total time was spent for this patient encounter, including preparation, face-to-face counseling with the patient, coordination of care, and documentation of the encounter.

## 2024-06-20 ENCOUNTER — Ambulatory Visit: Payer: Self-pay | Admitting: Obstetrics

## 2024-06-21 NOTE — Progress Notes (Unsigned)
 GYNECOLOGY  VISIT   HPI: 78 y.o.   Married  Caucasian female   G3P0011 with Patient's last menstrual period was 12/30/1983.   here for: Pessary fitting   Current pessary is an 80 mm incontinence dish.     She states is mostly works well.  It is comfortable. It holds the prolapse well.  Hard to get out if the knob rotates.   Did experience a rush of urine on a couple of occasions.   At her last office visit, she had some mild bilateral vaginal thinning.  GYNECOLOGIC HISTORY: Patient's last menstrual period was 12/30/1983. Contraception:  Hyst Menopausal hormone therapy:  Estrace   Last 2 paps:  Years ago History of abnormal Pap or positive HPV:  no Mammogram:  12/01/23 Breast density Cat B, BIRADS Cat 1 neg         OB History     Gravida  3   Para  2   Term      Preterm      AB  1   Living  1      SAB      IAB  1   Ectopic      Multiple      Live Births                 Patient Active Problem List   Diagnosis Date Noted   DOE (dyspnea on exertion) 01/07/2024   Dyslipidemia, goal LDL below 130 04/16/2023   Chronic heart failure with preserved ejection fraction (HCC) 02/06/2023   Grade 1 out of 6 intensity murmur 01/12/2023   Primary hypertension 01/12/2023   Stage 3b chronic kidney disease (HCC) 01/12/2023   Basal cell carcinoma of nose 09/30/2022   Need for influenza vaccination 12/19/2016   Ganglion cyst of right foot 04/25/2016   Low bone mass 04/25/2016   Thyroid  nodule 04/25/2016   Gastro-esophageal reflux disease with esophagitis 04/24/2016   Osteoarthritis 04/24/2016    Past Medical History:  Diagnosis Date   Cystocele 06/15/00   Hypertension    MVP (mitral valve prolapse)    Osteoporosis    bilateral hips   Thyroid  nodule 05/06/16   right and left thyroid  nodule     Past Surgical History:  Procedure Laterality Date   back fusion  2004, 1990   lumbar   BREAST BIOPSY Left    TUBAL LIGATION     BTSP   VAGINAL HYSTERECTOMY   1980's   Prolapsa    Current Outpatient Medications  Medication Sig Dispense Refill   B Complex Vitamins (VITAMIN B COMPLEX) TABS Take 1 tablet by mouth daily.     calcium  carbonate (SUPER CALCIUM ) 1500 (600 Ca) MG TABS tablet Take 1 tablet by mouth daily.     cholecalciferol 25 MCG (1000 UT) tablet Take 1,000 Units by mouth.     estradiol  (ESTRACE ) 0.1 MG/GM vaginal cream Use 1 g vaginally two times per week.   Also place a pea size amount to the urethra 3 - 4 times per week.     losartan  (COZAAR ) 100 MG tablet Take 1 tablet (100 mg total) by mouth daily. 90 tablet 0   loteprednol (LOTEMAX) 0.5 % ophthalmic suspension as needed.     rosuvastatin  (CRESTOR ) 5 MG tablet Take 1 tablet (5 mg total) by mouth daily. 90 tablet 1   No current facility-administered medications for this visit.     ALLERGIES: Thimerosal (thiomersal)  Family History  Problem Relation Age of Onset  Stroke Mother    Thyroid  disease Sister    Hypertension Maternal Aunt    Breast cancer Niece 64   Breast cancer Niece     Social History   Socioeconomic History   Marital status: Married    Spouse name: Not on file   Number of children: Not on file   Years of education: Not on file   Highest education level: Master's degree (e.g., MA, MS, MEng, MEd, MSW, MBA)  Occupational History   Not on file  Tobacco Use   Smoking status: Former    Types: Cigars    Quit date: 05/09/1973    Years since quitting: 51.1   Smokeless tobacco: Never   Tobacco comments:    In college a little  Vaping Use   Vaping status: Never Used  Substance and Sexual Activity   Alcohol use: Yes    Alcohol/week: 7.0 standard drinks of alcohol    Types: 7 Glasses of wine per week    Comment: a glass of wine at night, occ beer   Drug use: No   Sexual activity: Not Currently    Partners: Male    Birth control/protection: Post-menopausal, Surgical    Comment: TVH  Other Topics Concern   Not on file  Social History Narrative   Not  on file   Social Drivers of Health   Financial Resource Strain: Low Risk  (01/04/2024)   Overall Financial Resource Strain (CARDIA)    Difficulty of Paying Living Expenses: Not hard at all  Food Insecurity: No Food Insecurity (01/04/2024)   Hunger Vital Sign    Worried About Running Out of Food in the Last Year: Never true    Ran Out of Food in the Last Year: Never true  Transportation Needs: No Transportation Needs (01/04/2024)   PRAPARE - Administrator, Civil Service (Medical): No    Lack of Transportation (Non-Medical): No  Physical Activity: Insufficiently Active (01/04/2024)   Exercise Vital Sign    Days of Exercise per Week: 2 days    Minutes of Exercise per Session: 30 min  Stress: No Stress Concern Present (01/04/2024)   [employer] of Occupational Health - Occupational Stress Questionnaire    Feeling of Stress : Not at all  Social Connections: Socially Integrated (01/04/2024)   Social Connection and Isolation Panel    Frequency of Communication with Friends and Family: More than three times a week    Frequency of Social Gatherings with Friends and Family: Twice a week    Attends Religious Services: More than 4 times per year    Active Member of [employer] or Organizations: Yes    Attends Banker Meetings: More than 4 times per year    Marital Status: Married  Catering manager Violence: Not At Risk (08/27/2023)   Humiliation, Afraid, Rape, and Kick questionnaire    Fear of Current or Ex-Partner: No    Emotionally Abused: No    Physically Abused: No    Sexually Abused: No    Review of Systems  All other systems reviewed and are negative.   PHYSICAL EXAMINATION:   BP 116/82 (BP Location: Left Arm, Patient Position: Sitting)   Pulse 72   LMP 12/30/1983   SpO2 100%     General appearance: alert, cooperative and appears stated age   Pelvic: External genitalia:  no lesions              Urethra:  normal appearing urethra with no masses, tenderness or  lesions              Bartholins and Skenes: normal                 Vagina: mild erythema of the bilateral vaginal walls.  No ulceration.  No bleeding.               Cervix: absent                Bimanual Exam:  Uterus:  absent              Adnexa: no mass, fullness, tenderness  Patient's 80 mm incontinence dish removed.   Demo kit 75 mm incontinence dish fitted. Is comfortable.  Able to maintain the pessary with ambulation and sitting on the commode.  Did not void.  She had just voided.   Pessary removed.   Chaperone was present for exam:  Kari HERO, CMA  ASSESSMENT:  Vaginal prolapse following hysterectomy.  Pessary maintenance.  Stress incontinence.   PLAN:  75 mm incontinence dish with support, [facility].  Lot [id number], exp 10/09/2025 given to patient and placed vaginally for her.  I recommend removing the pessary less often.   Continue use of vaginal estrogen cream.  FU in 6 weeks.

## 2024-06-22 ENCOUNTER — Ambulatory Visit (INDEPENDENT_AMBULATORY_CARE_PROVIDER_SITE_OTHER): Admitting: Obstetrics and Gynecology

## 2024-06-22 ENCOUNTER — Encounter: Payer: Self-pay | Admitting: Obstetrics and Gynecology

## 2024-06-22 VITALS — BP 116/82 | HR 72

## 2024-06-22 DIAGNOSIS — N993 Prolapse of vaginal vault after hysterectomy: Secondary | ICD-10-CM | POA: Diagnosis not present

## 2024-06-22 DIAGNOSIS — Z4689 Encounter for fitting and adjustment of other specified devices: Secondary | ICD-10-CM | POA: Diagnosis not present

## 2024-07-06 ENCOUNTER — Encounter: Payer: Self-pay | Admitting: Internal Medicine

## 2024-07-06 ENCOUNTER — Ambulatory Visit: Payer: Self-pay | Admitting: Internal Medicine

## 2024-07-06 ENCOUNTER — Other Ambulatory Visit: Payer: Self-pay | Admitting: Cardiology

## 2024-07-06 ENCOUNTER — Ambulatory Visit (INDEPENDENT_AMBULATORY_CARE_PROVIDER_SITE_OTHER): Payer: Medicare Other | Admitting: Internal Medicine

## 2024-07-06 VITALS — BP 148/88 | HR 74 | Temp 97.8°F | Resp 16 | Ht 62.0 in | Wt 108.0 lb

## 2024-07-06 DIAGNOSIS — I10 Essential (primary) hypertension: Secondary | ICD-10-CM | POA: Diagnosis not present

## 2024-07-06 DIAGNOSIS — R1310 Dysphagia, unspecified: Secondary | ICD-10-CM | POA: Diagnosis not present

## 2024-07-06 DIAGNOSIS — E785 Hyperlipidemia, unspecified: Secondary | ICD-10-CM

## 2024-07-06 DIAGNOSIS — N1832 Chronic kidney disease, stage 3b: Secondary | ICD-10-CM

## 2024-07-06 DIAGNOSIS — K219 Gastro-esophageal reflux disease without esophagitis: Secondary | ICD-10-CM | POA: Diagnosis not present

## 2024-07-06 LAB — BASIC METABOLIC PANEL WITH GFR
BUN: 23 mg/dL (ref 6–23)
CO2: 31 meq/L (ref 19–32)
Calcium: 9.3 mg/dL (ref 8.4–10.5)
Chloride: 100 meq/L (ref 96–112)
Creatinine, Ser: 0.76 mg/dL (ref 0.40–1.20)
GFR: 75.43 mL/min (ref >60.00)
Glucose, Bld: 89 mg/dL (ref 70–99)
Potassium: 3.9 meq/L (ref 3.5–5.1)
Sodium: 138 meq/L (ref 135–145)

## 2024-07-06 LAB — URINALYSIS, ROUTINE W REFLEX MICROSCOPIC
Bilirubin Urine: NEGATIVE
Ketones, ur: NEGATIVE
Nitrite: NEGATIVE
Specific Gravity, Urine: 1.01 (ref 1.000–1.030)
Total Protein, Urine: NEGATIVE
Urine Glucose: NEGATIVE
Urobilinogen, UA: 0.2 (ref 0.0–1.0)
pH: 7 (ref 5.0–8.0)

## 2024-07-06 LAB — CBC WITH DIFFERENTIAL/PLATELET
Basophils Absolute: 0 K/uL (ref 0.0–0.1)
Basophils Relative: 0.8 % (ref 0.0–3.0)
Eosinophils Absolute: 0.1 K/uL (ref 0.0–0.7)
Eosinophils Relative: 4.1 % (ref 0.0–5.0)
HCT: 38.5 % (ref 36.0–46.0)
Hemoglobin: 12.8 g/dL (ref 12.0–15.0)
Lymphocytes Relative: 34.4 % (ref 12.0–46.0)
Lymphs Abs: 1.2 K/uL (ref 0.7–4.0)
MCHC: 33.2 g/dL (ref 30.0–36.0)
MCV: 86.7 fl (ref 78.0–100.0)
Monocytes Absolute: 0.4 K/uL (ref 0.1–1.0)
Monocytes Relative: 12.2 % — ABNORMAL HIGH (ref 3.0–12.0)
Neutro Abs: 1.7 K/uL (ref 1.4–7.7)
Neutrophils Relative %: 48.5 % (ref 43.0–77.0)
Platelets: 229 K/uL (ref 150.0–400.0)
RBC: 4.44 Mil/uL (ref 3.87–5.11)
RDW: 14 % (ref 11.5–15.5)
WBC: 3.5 K/uL — ABNORMAL LOW (ref 4.0–10.5)

## 2024-07-06 MED ORDER — VOQUEZNA 10 MG PO TABS: 1.0000 | ORAL_TABLET | Freq: Every day | ORAL | 1 refills | Status: DC

## 2024-07-06 MED ORDER — AMLODIPINE BESYLATE 5 MG PO TABS: 5.0000 mg | ORAL_TABLET | Freq: Every day | ORAL | 1 refills | Status: DC

## 2024-07-06 MED ORDER — ROSUVASTATIN CALCIUM 5 MG PO TABS: 5.0000 mg | ORAL_TABLET | Freq: Every day | ORAL | 1 refills | Status: DC

## 2024-07-06 NOTE — Patient Instructions (Signed)
 GERD in Adults: What to Know  Gastroesophageal reflux (GER) is when acid from your stomach flows up into your esophagus. Your esophagus is the part of your body that moves food from your mouth to your stomach. Normally, food goes down and stays in your stomach to be digested. But with GER, food and stomach acid may go back up. You may have a disease called gastroesophageal reflux disease (GERD) if the reflux: Happens often. Causes very bad symptoms. Makes your esophagus sore and swollen. Over time, GERD can make small holes called ulcers in the lining of your esophagus. What are the causes? GERD is caused by a problem with the muscle between your esophagus and stomach. This muscle is called the lower esophageal sphincter (LES). When it's weak or not normal, it doesn't close like it should. This means food and stomach acid can go back up into your esophagus. The muscle can be weak if: You smoke or use products with tobacco in them. You're pregnant. You have a type of hernia called a hiatal hernia. You eat certain foods and drinks. These include: Alcohol. Coffee. Chocolate. Onions. Peppermint. What increases the risk? Being overweight. Having a disease that affects your connective tissue. Taking NSAIDs, such as ibuprofen. What are the signs or symptoms? Heartburn. Trouble swallowing. Pain when you swallow. The feeling of having a lump in your throat. A bitter taste in your mouth. Bad breath. Having an upset or bloated stomach. Burping. Chest pain. Other conditions can also cause chest pain. Make sure you see your health care provider if you have chest pain. Wheezing. This is when you make high-pitched whistling sounds when you breathe, most often when you breathe out. A long-term cough or a cough at night. How is this diagnosed? GERD may be diagnosed based on your medical history and a physical exam. You may also have tests. These may include: An endoscopy. This test looks at your  stomach and esophagus with a small camera. A barium swallow test. This shows the shape and size of your esophagus and how well it's working. Tests of your esophagus to check for: Acid levels. Pressure. How is this treated? Treatment may depend on how bad your symptoms are. It may include: Changes to your diet and daily life. Medicines. Surgery. Follow these instructions at home: Eating and drinking Follow an eating plan as told by your provider. You may need to avoid certain foods and drinks. These may include: Coffee and tea, with or without caffeine. Alcohol. Energy drinks and sports drinks. Fizzy drinks or sodas. Chocolate and cocoa. Peppermint and mint flavorings. Garlic and onions. Horseradish. Spicy and acidic foods. These include: Peppers. Chili powder and curry powder. Vinegar. Hot sauces and BBQ sauce. Citrus fruits and juices. These include: Oranges. Lemons. Limes. Tomato-based foods. These include: Red sauce and pizza with red sauce. Chili. Salsa. Fried and fatty foods. These include: Donuts. [place] fries. Potato chips. High-fat dressings. High-fat meats. These include: Hot dogs and sausage. Rib eye steak. Ham and bacon. High-fat dairy items. These include: Whole milk. Butter. Cream cheese. Eat small meals often. Avoid eating big meals. Avoid drinking lots of liquid with your meals. Try not to eat meals during the 2-3 hours before bedtime. Try not to lie down right after you eat. Do not exercise right after you eat. Lifestyle  If you're overweight, lose an amount of weight that's healthy for you. Ask your provider about a safe weight loss goal. Do not smoke, vape, or use nicotine or tobacco. Wear  loose clothes. Do not wear things that are tight around your waist. When you sleep, try: Raising the head of your bed about 6 inches (15 cm). You can use a wedge to do this. Lying down on your left side. Try to lower your stress. If you need help doing  this, ask your provider. General instructions Take your medicines only as told. Do not take aspirin or ibuprofen unless you're told to. Watch for any changes in your symptoms. Do not bend over if it makes your symptoms worse. Contact a health care provider if: You have new symptoms. You have trouble: Drinking. Swallowing. Eating. It hurts to swallow. You have wheezing. You have a cough that won't go away. Your voice is hoarse. Your symptoms don't get better with treatment. Get help right away if: You have pain all of a sudden in your: Arm. Neck. Jaw. Teeth. Back. You feel sweaty, dizzy, or light-headed all of a sudden. You faint. You have chest pain or shortness of breath. You vomit and the vomit is: Green, yellow, or black. Looks like blood or coffee grounds. Your poop is red, bloody, or black. These symptoms may be an emergency. Call 911 right away. Do not wait to see if the symptoms will go away. Do not drive yourself to the hospital. This information is not intended to replace advice given to you by your health care provider. Make sure you discuss any questions you have with your health care provider. Document Revised: 10/27/2023 Document Reviewed: 05/13/2023 Elsevier Patient Education  2024 [employer].

## 2024-07-06 NOTE — Progress Notes (Signed)
 Subjective:  Patient ID: Tammy Bowen, female    DOB: 07-16-1946  Age: 78 y.o. MRN: [id number]  CC: Medical Management of Chronic Issues (6 month follow up. /Ongoing issue with her having so much phlegm in her throat that she's having a hard time clearing her throat. But when she begins to speak it almost like its coming up. Beatrix also has had some times where she feels breathless and can't catch her breath. ), Hypertension, Hyperlipidemia, and Gastroesophageal Reflux   HPI Tammy Bowen presents for f/up ----  Discussed the use of AI scribe software for clinical note transcription with the patient, who gave verbal consent to proceed.  History of Present Illness   Tammy Bowen is a 78 year old female who presents with persistent throat clearing and occasional shortness of breath.  She experiences persistent throat clearing that has been ongoing for weeks, described as a sensation of 'phlegm or something that won't clear' occurring intermittently. This is sometimes accompanied by a gurgling voice and a feeling of fullness in the throat. She has no history of allergies, heartburn, or indigestion. She has tried Zyrtec, which might have helped slightly, but the symptoms persist.  She denies nasal symptoms, coughing, wheezing, or chest pain. However, she occasionally experiences shortness of breath, described as an inability to 'get a breath' or 'fill my lungs.' This symptom was present for about three months but has not occurred in the past six weeks.  She occasionally experiences trouble swallowing, particularly with pills, described as 'pill dysphagia.' She has had difficulty swallowing pills for a long time, but it has become more pronounced recently. She does not have trouble swallowing food or liquids. She underwent an upper endoscopy 17 years ago, which was clear, and she has not experienced acid reflux since then.  No weight loss and her weight has been stable for a  long time.       Outpatient Medications Prior to Visit  Medication Sig Dispense Refill   B Complex Vitamins (VITAMIN B COMPLEX) TABS Take 1 tablet by mouth daily.     calcium  carbonate (SUPER CALCIUM ) 1500 (600 Ca) MG TABS tablet Take 1 tablet by mouth daily.     cholecalciferol 25 MCG (1000 UT) tablet Take 1,000 Units by mouth.     estradiol  (ESTRACE ) 0.1 MG/GM vaginal cream Use 1 g vaginally two times per week.   Also place a pea size amount to the urethra 3 - 4 times per week.     losartan  (COZAAR ) 100 MG tablet Take 1 tablet (100 mg total) by mouth daily. 90 tablet 0   loteprednol (LOTEMAX) 0.5 % ophthalmic suspension as needed.     rosuvastatin  (CRESTOR ) 5 MG tablet Take 1 tablet (5 mg total) by mouth daily. 90 tablet 1   No facility-administered medications prior to visit.    ROS Review of Systems  Constitutional:  Negative for appetite change, chills, diaphoresis, fatigue and fever.  HENT:  Positive for trouble swallowing and voice change. Negative for sinus pressure.   Eyes: Negative.   Respiratory: Negative.  Negative for cough, chest tightness, shortness of breath and wheezing.   Cardiovascular:  Negative for chest pain, palpitations and leg swelling.  Gastrointestinal:  Negative for abdominal pain, constipation, diarrhea, nausea and vomiting.  Endocrine: Negative.   Genitourinary: Negative.  Negative for difficulty urinating.  Musculoskeletal: Negative.  Negative for arthralgias and myalgias.  Skin: Negative.   Neurological: Negative.  Negative for dizziness, weakness and light-headedness.  Hematological:  Negative for adenopathy. Does not bruise/bleed easily.  Psychiatric/Behavioral: Negative.      Objective:  BP (!) 148/88 (BP Location: Left Arm, Patient Position: Sitting, Cuff Size: Small)   Pulse 74   Temp 97.8 F (36.6 C) (Oral)   Resp 16   Ht 5' 2 (1.575 m)   Wt 108 lb (49 kg)   LMP 12/30/1983   SpO2 97%   BMI 19.75 kg/m   BP Readings from Last 3  Encounters:  07/06/24 (!) 148/88  06/22/24 116/82  05/30/24 122/76    Wt Readings from Last 3 Encounters:  07/06/24 108 lb (49 kg)  05/04/24 108 lb (49 kg)  03/29/24 108 lb (49 kg)    Physical Exam Vitals reviewed.  Constitutional:      Appearance: Normal appearance.  HENT:     Nose: Nose normal.     Mouth/Throat:     Mouth: Mucous membranes are moist.  Eyes:     General: No scleral icterus.    Conjunctiva/sclera: Conjunctivae normal.  Cardiovascular:     Rate and Rhythm: Normal rate and regular rhythm.     Heart sounds: Murmur heard.     Systolic murmur is present with a grade of 1/6.     No diastolic murmur is present.     No friction rub. No gallop.     Comments: EKG-- NSR, 68 bpm LAE No LVH, Q waves, or ST/T wave changes  Pulmonary:     Effort: Pulmonary effort is normal.     Breath sounds: No stridor. No wheezing, rhonchi or rales.  Abdominal:     General: Abdomen is flat.     Palpations: There is no mass.     Tenderness: There is no abdominal tenderness. There is no guarding.     Hernia: No hernia is present.  Musculoskeletal:     Cervical back: Neck supple.     Right lower leg: No edema.     Left lower leg: No edema.  Lymphadenopathy:     Cervical: No cervical adenopathy.  Skin:    General: Skin is dry.  Neurological:     General: No focal deficit present.     Mental Status: She is alert. Mental status is at baseline.  Psychiatric:        Mood and Affect: Mood normal.        Behavior: Behavior normal.     Lab Results  Component Value Date   WBC 3.5 (L) 07/06/2024   HGB 12.8 07/06/2024   HCT 38.5 07/06/2024   PLT 229.0 07/06/2024   GLUCOSE 89 07/06/2024   CHOL 175 09/02/2023   TRIG 98.0 09/02/2023   HDL 63.30 09/02/2023   LDLCALC 92 09/02/2023   ALT 19 01/07/2024   AST 25 01/07/2024   NA 138 07/06/2024   K 3.9 07/06/2024   CL 100 07/06/2024   CREATININE 0.76 07/06/2024   BUN 23 07/06/2024   CO2 31 07/06/2024   TSH 3.10 01/07/2024     CT CARDIAC SCORING ([facility] LOCATIONS ONLY) Result Date: 02/03/2024 CLINICAL DATA:  78 year old Caucasian female under evaluation for coronary artery disease. * Tracking Code: FCC * EXAM: CT CARDIAC CORONARY ARTERY CALCIUM  SCORE TECHNIQUE: Non-contrast imaging through the heart was performed using prospective ECG gating. Image post processing was performed on an independent workstation, allowing for quantitative analysis of the heart and coronary arteries. Note that this exam targets the heart and the chest was not imaged in its entirety. COMPARISON:  No priors. FINDINGS: CORONARY CALCIUM   SCORES: Left Main: 0 LAD: 0 LCx: 0 RCA/PDA: 0 Total Agatston Score: 0 MESA database percentile: N/A AORTA MEASUREMENTS: Ascending Aorta: 3.4 cm Descending Aorta:2.5 cm OTHER FINDINGS: Atherosclerotic calcifications are noted in the thoracic aorta. Within the visualized portions of the thorax there are no large suspicious appearing pulmonary nodules or masses, there is no acute consolidative airspace disease, no pleural effusions, no pneumothorax and no lymphadenopathy. However, there are some scattered areas of mild cylindrical bronchiectasis with thickening of the peribronchovascular interstitium and regional architectural distortion most evident throughout the anterior aspects of the lungs bilaterally, with some associated peribronchovascular micro nodularity. Visualized portions of the upper abdomen are unremarkable. There are no aggressive appearing lytic or blastic lesions noted in the visualized portions of the skeleton. IMPRESSION: 1. Patient's total coronary artery calcium  score is 0 which indicates a very low (but non-zero) risk of significant coronary artery atherosclerosis. 2.  Aortic Atherosclerosis (ICD10-I70.0). 3. Scattered areas of mild bronchiectasis and scarring in the lungs, with scattered micronodularity, suggesting probable chronic indolent atypical infectious process such as MAI (mycobacterium avium  intracellulare) should be considered. Outpatient referral to Pulmonology for further clinical evaluation is suggested. Electronically Signed   By: Toribio Aye M.D.   On: 02/03/2024 07:17    Assessment & Plan:  Primary hypertension- She has not achieved her BP goal. EKG is negative for LVH. Will add a CCB. -     Basic metabolic panel with GFR; Future -     CBC with Differential/Platelet; Future -     EKG 12-Lead -     Urinalysis, Routine w reflex microscopic; Future -     amLODIPine  Besylate; Take 1 tablet (5 mg total) by mouth daily.  Dispense: 90 tablet; Refill: 1 -     AMB Referral [facility] Care Management  Pill dysphagia -     Ambulatory referral to Gastroenterology  Gastroesophageal reflux disease without esophagitis -     Voquezna ; Take 1 tablet by mouth daily.  Dispense: 90 tablet; Refill: 1  Dyslipidemia, goal LDL below 130 -     Rosuvastatin  Calcium ; Take 1 tablet (5 mg total) by mouth daily.  Dispense: 90 tablet; Refill: 1  Stage 3b chronic kidney disease (HCC)- Will avoid nephrotoxic agents      Follow-up: Return in about 6 months (around 01/06/2025).  Debby Molt, MD

## 2024-07-07 ENCOUNTER — Telehealth: Payer: Self-pay | Admitting: Cardiology

## 2024-07-07 MED ORDER — LOSARTAN POTASSIUM 100 MG PO TABS: 100.0000 mg | ORAL_TABLET | Freq: Every day | ORAL | 0 refills | Status: DC

## 2024-07-07 NOTE — Telephone Encounter (Signed)
*  STAT* If patient is at the pharmacy, call can be transferred to refill team.   1. Which medications need to be refilled? (please list name of each medication and dose if known)   losartan  (COZAAR ) 100 MG tablet    2. Which pharmacy/location (including street and city if local pharmacy) is medication to be sent to?  [facility] [place], [place] - [address]      3. Do they need a 30 day or 90 day supply? 90 day    Pt is out of medication

## 2024-07-07 NOTE — Telephone Encounter (Signed)
 Pt's medication was sent to pt's pharmacy as requested. Confirmation received.

## 2024-07-07 NOTE — Telephone Encounter (Signed)
*  STAT* If patient is at the pharmacy, call can be transferred to refill team.   1. Which medications need to be refilled? (please list name of each medication and dose if known) Losartan    2. Would you like to learn more about the convenience, safety, & potential cost savings by using the [facility]?     3. Are you open to using the [facility] (Type [facility].    4. Which pharmacy/location (including street and city if local pharmacy) is medication to be sent to?[facility]   5. Do they need a 30 day or 90 day supply? Need enough until her appointment on 07-19-24-please call in today- out of medicine

## 2024-07-16 NOTE — Progress Notes (Unsigned)
 Cardiology Office Note:    Date:  07/19/2024   ID:  Tammy Bowen, DOB 16-Feb-1946, MRN [id number]  PCP:  Joshua Debby CROME, MD   [facility] Providers Cardiologist:  None     Referring MD: Joshua Debby CROME, MD   Chief Complaint  Patient presents with   Hypertension   Hyperlipidemia    History of Present Illness:    Tammy Bowen is a 78 y.o. female seen at the request of Dr Joshua for evaluation of a heart murmur and diastolic dysfunction. She has HTN and had been on chronic therapy with losartan , HCT and atenolol . When seen by Dr Joshua she was bradycardic so tapered off atenolol . HCTZ was also discontinued for ?CKD. She is still on losartan . She was noted to have a murmur and sent for an Echo as noted below.   She was seen by Dr Joshua earlier. Noted persistently elevated BP despite increase in losartan  to 100 mg. Added amlodipine  and BP has been excellent.  She denies any chest pain, palpitations, or swelling. She did have a coronary calcium  score of 0.   She previously was a Runner, broadcasting/film/video at [employer]. Her son is a Insurance account manager in [place].   Past Medical History:  Diagnosis Date   Cystocele 06/15/00   Hypertension    MVP (mitral valve prolapse)    Osteoporosis    bilateral hips   Thyroid  nodule 05/06/16   right and left thyroid  nodule     Past Surgical History:  Procedure Laterality Date   back fusion  2004, 1990   lumbar   BREAST BIOPSY Left    TUBAL LIGATION     BTSP   VAGINAL HYSTERECTOMY  1980's   Prolapsa    Current Medications: Current Meds  Medication Sig   amLODipine  (NORVASC ) 5 MG tablet Take 1 tablet (5 mg total) by mouth daily.   B Complex Vitamins (VITAMIN B COMPLEX) TABS Take 1 tablet by mouth daily.   calcium  carbonate (SUPER CALCIUM ) 1500 (600 Ca) MG TABS tablet Take 1 tablet by mouth daily.   cholecalciferol 25 MCG (1000 UT) tablet Take 1,000 Units by mouth.   estradiol  (ESTRACE ) 0.1 MG/GM vaginal cream Use 1 g vaginally two times per week.    Also place a pea size amount to the urethra 3 - 4 times per week.   losartan  (COZAAR ) 100 MG tablet Take 1 tablet (100 mg total) by mouth daily.   loteprednol (LOTEMAX) 0.5 % ophthalmic suspension as needed.   rosuvastatin  (CRESTOR ) 5 MG tablet Take 1 tablet (5 mg total) by mouth daily.     Allergies:   Thimerosal (thiomersal)   Social History   Socioeconomic History   Marital status: Married    Spouse name: Not on file   Number of children: Not on file   Years of education: Not on file   Highest education level: Master's degree (e.g., MA, MS, MEng, MEd, MSW, MBA)  Occupational History   Not on file  Tobacco Use   Smoking status: Former    Types: Cigars    Quit date: 05/09/1973    Years since quitting: 51.2   Smokeless tobacco: Never   Tobacco comments:    In college a little  Vaping Use   Vaping status: Never Used  Substance and Sexual Activity   Alcohol use: Yes    Alcohol/week: 7.0 standard drinks of alcohol    Types: 7 Glasses of wine per week    Comment: a glass of wine at  night, occ beer   Drug use: No   Sexual activity: Not Currently    Partners: Male    Birth control/protection: Post-menopausal, Surgical    Comment: TVH  Other Topics Concern   Not on file  Social History Narrative   Not on file   Social Drivers of Health   Financial Resource Strain: Low Risk  (07/04/2024)   Overall Financial Resource Strain (CARDIA)    Difficulty of Paying Living Expenses: Not hard at all  Food Insecurity: No Food Insecurity (07/04/2024)   Hunger Vital Sign    Worried About Running Out of Food in the Last Year: Never true    Ran Out of Food in the Last Year: Never true  Transportation Needs: No Transportation Needs (07/04/2024)   PRAPARE - Administrator, Civil Service (Medical): No    Lack of Transportation (Non-Medical): No  Physical Activity: Insufficiently Active (07/04/2024)   Exercise Vital Sign    Days of Exercise per Week: 2 days    Minutes of Exercise per  Session: 20 min  Stress: No Stress Concern Present (07/04/2024)   [employer] of Occupational Health - Occupational Stress Questionnaire    Feeling of Stress: Only a little  Social Connections: Socially Integrated (07/04/2024)   Social Connection and Isolation Panel    Frequency of Communication with Friends and Family: More than three times a week    Frequency of Social Gatherings with Friends and Family: Twice a week    Attends Religious Services: More than 4 times per year    Active Member of [employer] or Organizations: Yes    Attends Engineer, structural: More than 4 times per year    Marital Status: Married     Family History: The patient's family history includes Breast cancer in her niece; Breast cancer (age of onset: 43) in her niece; Hypertension in her maternal aunt; Stroke in her mother; Thyroid  disease in her sister.  ROS:   Please see the history of present illness.     All other systems reviewed and are negative.  EKGs/Labs/Other Studies Reviewed:    The following studies were reviewed today: Eccho 02/05/23: IMPRESSIONS     1. Aliasing below the aortic valve in the LVOT is noted, suggestive of  elevated flow but no significant obstruction. This may explain the murmur.  Left ventricular ejection fraction, by estimation, is 60 to 65%. Left  ventricular ejection fraction by 3D  volume is 63 %. The left ventricle has normal function. The left ventricle  has no regional wall motion abnormalities. There is mild asymmetric left  ventricular hypertrophy of the basal-septal segment. Left ventricular  diastolic parameters are consistent  with Grade I diastolic dysfunction (impaired relaxation).   2. Right ventricular systolic function is normal. The right ventricular  size is normal. There is normal pulmonary artery systolic pressure. The  estimated right ventricular systolic pressure is 20.8 mmHg.   3. The mitral valve is abnormal. Trivial mitral valve regurgitation.    4. The aortic valve is tricuspid. Aortic valve regurgitation is not  visualized. Aortic valve sclerosis is present, with no evidence of aortic  valve stenosis.   5. The inferior vena cava is normal in size with greater than 50%  respiratory variability, suggesting right atrial pressure of 3 mmHg.   Comparison(s): No prior Echocardiogram.    EKG:  EKG is  ordered today.  The ekg ordered today demonstrates NSR rate 57. No acute change. I have personally reviewed and interpreted  this study.   Recent Labs: 01/07/2024: ALT 19; Pro B Natriuretic peptide (BNP) 22.0; TSH 3.10 07/06/2024: BUN 23; Creatinine, Ser 0.76; Hemoglobin 12.8; Platelets 229.0; Potassium 3.9; Sodium 138  Recent Lipid Panel    Component Value Date/Time   CHOL 175 09/02/2023 1114   TRIG 98.0 09/02/2023 1114   HDL 63.30 09/02/2023 1114   CHOLHDL 3 09/02/2023 1114   VLDL 19.6 09/02/2023 1114   LDLCALC 92 09/02/2023 1114     Risk Assessment/Calculations:     Coronary calcium  score: 02/02/24: IMPRESSION: 1. Patient's total coronary artery calcium  score is 0 which indicates a very low (but non-zero) risk of significant coronary artery atherosclerosis. 2.  Aortic Atherosclerosis (ICD10-I70.0). 3. 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 Outpatient referral to Pulmonology for further clinical evaluation is suggested.     Electronically Signed   By: Toribio Aye M.D.   On: 02/03/2024 07:17         Physical Exam:    VS:  BP 124/70   Pulse 83   Ht 5' 2.5 (1.588 m)   Wt 107 lb 6.4 oz (48.7 kg)   LMP 12/30/1983   SpO2 99%   BMI 19.33 kg/m     Wt Readings from Last 3 Encounters:  07/19/24 107 lb 6.4 oz (48.7 kg)  07/06/24 108 lb (49 kg)  05/04/24 108 lb (49 kg)     GEN:  Well nourished, well developed in no  acute distress HEENT: Normal NECK: No JVD; No carotid bruits LYMPHATICS: No lymphadenopathy CARDIAC: RRR, gr 1/6 systolic murmur RUSB, no rubs, gallops RESPIRATORY:  Clear to auscultation without rales, wheezing or rhonchi  ABDOMEN: Soft, non-tender, non-distended MUSCULOSKELETAL:  No edema; No deformity  SKIN: Warm and dry NEUROLOGIC:  Alert and oriented x 3 PSYCHIATRIC:  Normal affect   ASSESSMENT:    1. Primary hypertension   2. Murmur, cardiac     PLAN:    In order of problems listed above:  HTN primary. BP now well controlled on combination of losartan  and amlodipine .  Murmur. I think the murmur is functional with some turbulence in the LVOT due to basal septum and MV creating some narrowing. No further evaluation needed. Coronary calcium  score of 0. Very reassuring. Very low CV risk     Will follow up as needed.      Medication Adjustments/Labs and Tests Ordered: Current medicines are reviewed at length with the patient today.  Concerns regarding medicines are outlined above.  No orders of the defined types were placed in this encounter.  No orders of the defined types were placed in this encounter.   Patient Instructions  Medication Instructions:  Continue same medications  Lab Work: None ordered  Testing/Procedures: None ordered  Follow-Up: At [facility], you and your health needs are our priority.  As part of our continuing mission to provide you with exceptional heart care, our providers are all part of one team.  This team includes your primary Cardiologist (physician) and Advanced Practice Providers or APPs (Physician Assistants and Nurse Practitioners) who all work together to provide you with the care you need, when you need it.  Your next appointment:  As Needed    Provider:  Dr.Lamonica Trueba   We recommend signing up for the patient portal called MyChart.  Sign up information is provided on this After Visit Summary.  MyChart is used  to  connect with patients for Virtual Visits (Telemedicine).  Patients are able to view lab/test results, encounter notes, upcoming appointments, etc.  Non-urgent messages can be sent to your provider as well.   To learn more about what you can do with MyChart, go to [email or web address].          Signed, Solimar Maiden [place], MD  07/19/2024 8:55 AM    [facility]

## 2024-07-18 ENCOUNTER — Telehealth: Payer: Self-pay | Admitting: *Deleted

## 2024-07-18 NOTE — Progress Notes (Signed)
 Care Guide Pharmacy Note  07/18/2024 Name: Tammy Bowen MRN: [id number] DOB: 11-18-1946  Referred By: Joshua Debby CROME, MD Reason for referral: Call Attempt #1 and Complex Care Management (Outreach to schedule referral with pharmacist )   [name] is a 78 y.o. year old female who is a primary care patient of Joshua Debby CROME, MD.  [name] was referred to the pharmacist for assistance related to: HTN  Successful contact was made with the patient to discuss pharmacy services including being ready for the pharmacist to call at least 5 minutes before the scheduled appointment time and to have medication bottles and any blood pressure readings ready for review. The patient agreed to meet with the pharmacist via telephone visit on 08/01/2024  Thedford Franks, CMA [place]  [facility], [facility] Guide Direct Dial: [phone]  Fax: [phone] Website: [email or web address]

## 2024-07-19 ENCOUNTER — Ambulatory Visit: Attending: Cardiology | Admitting: Cardiology

## 2024-07-19 ENCOUNTER — Encounter: Payer: Self-pay | Admitting: Cardiology

## 2024-07-19 VITALS — BP 124/70 | HR 83 | Ht 62.5 in | Wt 107.4 lb

## 2024-07-19 DIAGNOSIS — R011 Cardiac murmur, unspecified: Secondary | ICD-10-CM | POA: Diagnosis not present

## 2024-07-19 DIAGNOSIS — I10 Essential (primary) hypertension: Secondary | ICD-10-CM | POA: Insufficient documentation

## 2024-07-19 NOTE — Patient Instructions (Signed)

## 2024-07-29 ENCOUNTER — Encounter: Payer: Self-pay | Admitting: Family Medicine

## 2024-07-29 ENCOUNTER — Ambulatory Visit (INDEPENDENT_AMBULATORY_CARE_PROVIDER_SITE_OTHER): Admitting: Family Medicine

## 2024-07-29 VITALS — BP 128/60 | HR 87 | Temp 97.7°F | Wt 107.0 lb

## 2024-07-29 DIAGNOSIS — B9689 Other specified bacterial agents as the cause of diseases classified elsewhere: Secondary | ICD-10-CM

## 2024-07-29 DIAGNOSIS — R051 Acute cough: Secondary | ICD-10-CM

## 2024-07-29 DIAGNOSIS — J069 Acute upper respiratory infection, unspecified: Secondary | ICD-10-CM | POA: Diagnosis not present

## 2024-07-29 LAB — POC COVID19 BINAXNOW: SARS Coronavirus 2 Ag: NEGATIVE

## 2024-07-29 MED ORDER — BENZONATATE 200 MG PO CAPS: 200.0000 mg | ORAL_CAPSULE | Freq: Two times a day (BID) | ORAL | 0 refills | Status: DC | PRN

## 2024-07-29 MED ORDER — AZITHROMYCIN 250 MG PO TABS: ORAL_TABLET | ORAL | 0 refills | Status: AC

## 2024-07-29 NOTE — Progress Notes (Signed)
 Acute Office Visit  Subjective:     Patient ID: Tammy Bowen, female    DOB: [birth year], 78 y.o.   MRN: [id number]  No chief complaint on file.   HPI Patient reports for evaluation of cold-like symptoms. Her symptoms include productive cough with yellow, blood-tinged sputum, lightheadedness, sneezing, nasal congestion, fatigue, generalized body aches, and ear congestion for the last 8 days. She also complains of intermittent, low-grade fever x 4-5 days. She experienced chills yesterday evening. She has been taking acetaminophen and ibuprofen for fevers and body aches.   ROS See HPI     Objective:    BP 128/60 (BP Location: Left Arm, Patient Position: Sitting, Cuff Size: Small)   Pulse 87   Temp 97.7 F (36.5 C) (Oral)   Wt 107 lb (48.5 kg)   LMP 12/30/1983   SpO2 96%   BMI 19.26 kg/m    Physical Exam Vitals reviewed.  Constitutional:      Appearance: Normal appearance.  HENT:     Head: Normocephalic and atraumatic.     Right Ear: Tympanic membrane normal.     Left Ear: Tympanic membrane normal.     Nose: Congestion and rhinorrhea present.     Mouth/Throat:     Mouth: Mucous membranes are moist.     Pharynx: Posterior oropharyngeal erythema present.  Eyes:     Conjunctiva/sclera: Conjunctivae normal.     Pupils: Pupils are equal, round, and reactive to light.  Cardiovascular:     Rate and Rhythm: Normal rate and regular rhythm.     Pulses: Normal pulses.     Heart sounds: Normal heart sounds.  Pulmonary:     Effort: Pulmonary effort is normal.     Breath sounds: Normal breath sounds.  Lymphadenopathy:     Cervical: No cervical adenopathy.  Skin:    General: Skin is warm and dry.     Capillary Refill: Capillary refill takes less than 2 seconds.  Neurological:     Mental Status: She is alert and oriented to person, place, and time.  Psychiatric:        Mood and Affect: Mood normal.        Behavior: Behavior normal.     Results for orders placed  or performed in visit on 07/29/24  POC COVID-19 BinaxNow  Result Value Ref Range   SARS Coronavirus 2 Ag Negative Negative      Assessment & Plan:   Problem List Items Addressed This Visit   None Visit Diagnoses       Bacterial URI    -  Primary   Relevant Medications   azithromycin  (ZITHROMAX ) 250 MG tablet     Acute cough       Relevant Medications   azithromycin  (ZITHROMAX ) 250 MG tablet   benzonatate  (TESSALON ) 200 MG capsule   Other Relevant Orders   POC COVID-19 BinaxNow (Completed)     - COVID test negative  Meds ordered this encounter  Medications   azithromycin  (ZITHROMAX ) 250 MG tablet    Sig: Take 2 tablets on day 1, then 1 tablet daily on days 2 through 5    Dispense:  6 tablet    Refill:  0   benzonatate  (TESSALON ) 200 MG capsule    Sig: Take 1 capsule (200 mg total) by mouth 2 (two) times daily as needed for cough.    Dispense:  20 capsule    Refill:  0    Return if symptoms worsen or fail to improve.  Debby CHRISTELLA Borer, RN Corean LITTIE Ku, FNP

## 2024-07-31 ENCOUNTER — Encounter: Payer: Self-pay | Admitting: Family Medicine

## 2024-08-01 ENCOUNTER — Other Ambulatory Visit: Admitting: Pharmacist

## 2024-08-01 DIAGNOSIS — I10 Essential (primary) hypertension: Secondary | ICD-10-CM

## 2024-08-01 NOTE — Progress Notes (Signed)
 08/01/2024 Name: Tammy Bowen MRN: [id number] DOB: [birth year]  Chief Complaint  Patient presents with   Hypertension   Medication Management    NYKIA TURKO is a 78 y.o. year old female who presented for a telephone visit.   They were referred to the pharmacist by their PCP for assistance in managing hypertension.   Subjective:  Care Team: Primary Care Provider: Joshua Debby CROME, MD ; Next Scheduled Visit: 01/09/2025  Medication Access/Adherence  Current Pharmacy:  [facility] [place], [place] - [address] [place] [address] Phone: [phone] Fax: [phone]  [facility] [place], [place] - [address] [place] [address] Phone: [phone] Fax: [phone]   Patient reports affordability concerns with their medications: Yes  Patient reports access/transportation concerns to their pharmacy: No  Patient reports adherence concerns with their medications:  No    Has not started Voquezna  because GI provider advised her to not start until after upcoming endoscopy. She also noted the pharmacy told her it was going to cost $900.   Hypertension:  Current medications: losartan  100 mg daily, amlodipine  5 mg daily Medications previously tried: hydrochlorothiazide, atenolol   Patient has a validated, automated, upper arm home BP cuff Current blood pressure readings: Yesterday 121/81 110/67 122/71  Patient denies hypotensive s/sx including dizziness, lightheadedness.  Patient denies hypertensive symptoms including headache, chest pain, shortness of breath    Objective: BP Readings from Last 3 Encounters:  07/29/24 128/60  07/19/24 124/70  07/06/24 (!) 148/88     No results found for: HGBA1C  Lab Results  Component Value Date   CREATININE 0.76 07/06/2024   BUN 23 07/06/2024   NA 138 07/06/2024   K 3.9 07/06/2024   CL 100 07/06/2024   CO2 31  07/06/2024    Lab Results  Component Value Date   CHOL 175 09/02/2023   HDL 63.30 09/02/2023   LDLCALC 92 09/02/2023   TRIG 98.0 09/02/2023   CHOLHDL 3 09/02/2023    Medications Reviewed Today     Reviewed by Merceda Lela SAUNDERS, RPH (Pharmacist) on 08/01/24 at [phone]  Med List Status: <None>   Medication Order Taking? Sig Documenting Provider Last Dose Status Informant  amLODipine  (NORVASC ) 5 MG tablet [id number] Yes Take 1 tablet (5 mg total) by mouth daily. Joshua Debby CROME, MD  Active   azithromycin  (ZITHROMAX ) 250 MG tablet [id number] Yes Take 2 tablets on day 1, then 1 tablet daily on days 2 through 5 Alvia Corean CROME, FNP  Active   B Complex Vitamins (VITAMIN B COMPLEX) TABS [id number] Yes Take 1 tablet by mouth daily. [provider]  Active   benzonatate  (TESSALON ) 200 MG capsule [id number] Yes Take 1 capsule (200 mg total) by mouth 2 (two) times daily as needed for cough. Alvia Corean CROME, FNP  Active   calcium  carbonate (SUPER CALCIUM ) 1500 (600 Ca) MG TABS tablet [id number] Yes Take 1 tablet by mouth daily. [provider]  Active   cholecalciferol 25 MCG (1000 UT) tablet [id number] Yes Take 1,000 Units by mouth. [provider]  Active   estradiol  (ESTRACE ) 0.1 MG/GM vaginal cream [id number]  Use 1 g vaginally two times per week.   Also place a pea size amount to the urethra 3 - 4 times per week. Cathlyn BROCKS Nikki Bobie FORBES, MD  Active   losartan  (COZAAR ) 100 MG tablet [id number] Yes Take 1 tablet (  100 mg total) by mouth daily. [place], Peter M, MD  Active   loteprednol (LOTEMAX) 0.5 % ophthalmic suspension [id number]  as needed. [provider]  Active            Med Note VERTIS, GLENNIS   Wed Feb 25, 2023  3:27 PM) AS NEEDED   rosuvastatin  (CRESTOR ) 5 MG tablet [id number] Yes Take 1 tablet (5 mg total) by mouth daily. Joshua Debby CROME, MD  Active   Vonoprazan Fumarate  (VOQUEZNA ) 10 MG TABS [id number]  Take 1 tablet by mouth daily.  Patient  not taking: Reported on 08/01/2024   Joshua Debby CROME, MD  Active               Assessment/Plan:   Hypertension: - Currently controlled, Bp goal <130/80. Has been controlled at home and at recent OV - Reviewed appropriate blood pressure monitoring technique and reviewed goal blood pressure. Recommended to check home blood pressure and heart rate daily - Recommend to continue current regimen  - She wishes for future losartan  refills to be prescribed by Dr. Joshua. Advised her to call the office in October when losartan  is due for refill.    Follow Up Plan: PRN  Darrelyn Drum, PharmD, BCPS, CPP Clinical Pharmacist Practitioner [facility] [phone]

## 2024-08-03 ENCOUNTER — Ambulatory Visit: Admitting: Obstetrics and Gynecology

## 2024-08-22 ENCOUNTER — Encounter: Payer: Self-pay | Admitting: Obstetrics and Gynecology

## 2024-08-30 ENCOUNTER — Ambulatory Visit (INDEPENDENT_AMBULATORY_CARE_PROVIDER_SITE_OTHER): Payer: Medicare Other

## 2024-08-30 VITALS — Ht 62.5 in | Wt 107.0 lb

## 2024-08-30 DIAGNOSIS — Z0000 Encounter for general adult medical examination without abnormal findings: Secondary | ICD-10-CM | POA: Diagnosis not present

## 2024-08-30 NOTE — Progress Notes (Signed)
 Subjective:   Tammy Bowen is a 78 y.o. who presents for a Medicare Wellness preventive visit.  As a reminder, Annual Wellness Visits don't include a physical exam, and some assessments may be limited, especially if this visit is performed virtually. We may recommend an in-person follow-up visit with your provider if needed.  Visit Complete: Virtual I connected with  Tammy Bowen on 08/30/24 by a audio enabled telemedicine application and verified that I am speaking with the correct person using two identifiers.  Patient Location: Home  Provider Location: Office/Clinic  I discussed the limitations of evaluation and management by telemedicine. The patient expressed understanding and agreed to proceed.  Vital Signs: Because this visit was a virtual/telehealth visit, some criteria may be missing or patient reported. Any vitals not documented were not able to be obtained and vitals that have been documented are patient reported.  VideoDeclined- This patient declined Librarian, academic. Therefore the visit was completed with audio only.  Persons Participating in Visit: Patient.  AWV Questionnaire: No: Patient Medicare AWV questionnaire was not completed prior to this visit.  Cardiac Risk Factors include: advanced age (>74men, >68 women);dyslipidemia;hypertension     Objective:    Today's Vitals   08/30/24 0859  Weight: 107 lb (48.5 kg)  Height: 5' 2.5 (1.588 m)   Body mass index is 19.26 kg/m.     08/30/2024    8:59 AM 08/27/2023    9:04 AM 12/17/2022   11:42 AM 09/30/2022    7:49 AM 08/12/2017    1:46 PM  Advanced Directives  Does Patient Have a Medical Advance Directive? Yes Yes No Yes No   Type of Estate agent of [place];Living will Healthcare Power of [place];Living will  Healthcare Power of Attorney   Does patient want to make changes to medical advance directive?    No - Patient declined   Copy of Healthcare  Power of Attorney in Chart? No - copy requested No - copy requested  No - copy requested   Would patient like information on creating a medical advance directive?   No - Patient declined       Data saved with a previous flowsheet row definition    Current Medications (verified) Outpatient Encounter Medications as of 08/30/2024  Medication Sig   amLODipine  (NORVASC ) 5 MG tablet Take 1 tablet (5 mg total) by mouth daily.   B Complex Vitamins (VITAMIN B COMPLEX) TABS Take 1 tablet by mouth daily.   benzonatate  (TESSALON ) 200 MG capsule Take 1 capsule (200 mg total) by mouth 2 (two) times daily as needed for cough.   calcium  carbonate (SUPER CALCIUM ) 1500 (600 Ca) MG TABS tablet Take 1 tablet by mouth daily.   cholecalciferol 25 MCG (1000 UT) tablet Take 1,000 Units by mouth.   estradiol  (ESTRACE ) 0.1 MG/GM vaginal cream Use 1 g vaginally two times per week.   Also place a pea size amount to the urethra 3 - 4 times per week.   losartan  (COZAAR ) 100 MG tablet Take 1 tablet (100 mg total) by mouth daily.   loteprednol (LOTEMAX) 0.5 % ophthalmic suspension as needed.   rosuvastatin  (CRESTOR ) 5 MG tablet Take 1 tablet (5 mg total) by mouth daily.   Vonoprazan Fumarate  (VOQUEZNA ) 10 MG TABS Take 1 tablet by mouth daily. (Patient not taking: Reported on 08/30/2024)   No facility-administered encounter medications on file as of 08/30/2024.    Allergies (verified) Thimerosal (thiomersal)   History: Past Medical History:  Diagnosis Date   Cystocele 06/15/2000   Esophageal stenosis 2025   endoscopy with Dr. Rollin   Hypertension    MVP (mitral valve prolapse)    Osteoporosis    bilateral hips   Thyroid  nodule 05/06/2016   right and left thyroid  nodule    Past Surgical History:  Procedure Laterality Date   back fusion  2004, 1990   lumbar   BREAST BIOPSY Left    ESOPHAGEAL DILATION  2025   Dr. Rollin   TUBAL LIGATION     BTSP   VAGINAL HYSTERECTOMY  08/30/1979   Prolapsa   Family History   Problem Relation Age of Onset   Stroke Mother    Thyroid  disease Sister    Hypertension Maternal Aunt    Breast cancer Niece 86   Breast cancer Niece    Social History   Socioeconomic History   Marital status: Married    Spouse name: Not on file   Number of children: Not on file   Years of education: Not on file   Highest education level: Master's degree (e.g., MA, MS, MEng, MEd, MSW, MBA)  Occupational History   Not on file  Tobacco Use   Smoking status: Former    Types: Cigars    Quit date: 05/09/1973    Years since quitting: 51.3   Smokeless tobacco: Never   Tobacco comments:    In college a little  Vaping Use   Vaping status: Never Used  Substance and Sexual Activity   Alcohol use: Yes    Alcohol/week: 7.0 standard drinks of alcohol    Types: 7 Glasses of wine per week    Comment: a glass of wine at night, occ beer   Drug use: No   Sexual activity: Not Currently    Partners: Male    Birth control/protection: Post-menopausal, Surgical    Comment: TVH  Other Topics Concern   Not on file  Social History Narrative   Married   Social Drivers of Health   Financial Resource Strain: Low Risk  (08/30/2024)   Overall Financial Resource Strain (CARDIA)    Difficulty of Paying Living Expenses: Not hard at all  Food Insecurity: No Food Insecurity (08/30/2024)   Hunger Vital Sign    Worried About Running Out of Food in the Last Year: Never true    Ran Out of Food in the Last Year: Never true  Transportation Needs: No Transportation Needs (08/30/2024)   PRAPARE - Administrator, Civil Service (Medical): No    Lack of Transportation (Non-Medical): No  Physical Activity: Sufficiently Active (08/30/2024)   Exercise Vital Sign    Days of Exercise per Week: 5 days    Minutes of Exercise per Session: 30 min  Recent Concern: Physical Activity - Insufficiently Active (07/04/2024)   Exercise Vital Sign    Days of Exercise per Week: 2 days    Minutes of Exercise per  Session: 20 min  Stress: No Stress Concern Present (08/30/2024)   [employer] of Occupational Health - Occupational Stress Questionnaire    Feeling of Stress: Not at all  Social Connections: Socially Integrated (08/30/2024)   Social Connection and Isolation Panel    Frequency of Communication with Friends and Family: More than three times a week    Frequency of Social Gatherings with Friends and Family: More than three times a week    Attends Religious Services: More than 4 times per year    Active Member of [employer] or Organizations: Yes  Attends Banker Meetings: More than 4 times per year    Marital Status: Married    Tobacco Counseling Counseling given: Not Answered Tobacco comments: In college a little    Clinical Intake:  Pre-visit preparation completed: Yes  Pain : No/denies pain     BMI - recorded: 19.26 Nutritional Status: BMI of 19-24  Normal Nutritional Risks: None Diabetes: No  No results found for: HGBA1C   How often do you need to have someone help you when you read instructions, pamphlets, or other written materials from your doctor or pharmacy?: 1 - Never  Interpreter Needed?: No  Information entered by :: Verdie Saba, CMA   Activities of Daily Living     08/30/2024    9:03 AM  In your present state of health, do you have any difficulty performing the following activities:  Hearing? 0  Comment wears hearing aids  Vision? 0  Difficulty concentrating or making decisions? 0  Walking or climbing stairs? 0  Dressing or bathing? 0  Doing errands, shopping? 0  Preparing Food and eating ? N  Using the Toilet? N  In the past six months, have you accidently leaked urine? Y  Comment wears a pad/pantyliner  Do you have problems with loss of bowel control? N  Managing your Medications? N  Managing your Finances? N  Housekeeping or managing your Housekeeping? N    Patient Care Team: Joshua Debby CROME, MD as PCP - General (Internal  Medicine) Malmfelt, Delon CROME, RN as Oncology Nurse Navigator Izell Domino, MD as Consulting Physician (Radiation Oncology) Jadine Charleston, MD as Consulting Physician (Dermatology) Cathlyn JAYSON Cary, Bobie BRAVO, MD as Consulting Physician (Obstetrics and Gynecology) Leslee Reusing, MD as Consulting Physician (Ophthalmology)  I have updated your Care Teams any recent Medical Services you may have received from other providers in the past year.     Assessment:   This is a routine wellness examination for [facility].  Hearing/Vision screen Hearing Screening - Comments:: Wears hearing aids Vision Screening - Comments:: Wears rx glasses - up to date with routine eye exams with Reusing Leslee   Goals Addressed               This Visit's Progress     Patient Stated (pt-stated)        Patient stated she plans to continue staying active (walking sometimes)       Depression Screen     08/30/2024    9:04 AM 07/06/2024   11:04 AM 12/03/2023    2:05 PM 11/10/2023    2:01 PM 08/27/2023    9:07 AM 02/25/2023    3:28 PM 01/12/2023    8:49 AM  PHQ 2/9 Scores  PHQ - 2 Score 0 0 0 0 0 0 0  PHQ- 9 Score 0    0 0 0    Fall Risk     08/30/2024    9:04 AM 07/06/2024   11:04 AM 12/03/2023    2:05 PM 11/10/2023    2:01 PM 08/27/2023    9:05 AM  Fall Risk   Falls in the past year? 0 0 0 0 0  Number falls in past yr: 0 0 0 0 0  Injury with Fall? 0 0 0 0 0  Risk for fall due to : No Fall Risks No Fall Risks No Fall Risks No Fall Risks No Fall Risks  Follow up Falls evaluation completed;Falls prevention discussed Falls evaluation completed Falls evaluation completed Falls evaluation completed Falls prevention  discussed    MEDICARE RISK AT HOME:  Medicare Risk at Home Any stairs in or around the home?: Yes If so, are there any without handrails?: No Home free of loose throw rugs in walkways, pet beds, electrical cords, etc?: Yes Adequate lighting in your home to reduce risk of falls?:  Yes Life alert?: No Use of a cane, walker or w/c?: No Grab bars in the bathroom?: Yes Shower chair or bench in shower?: Yes Elevated toilet seat or a handicapped toilet?: No  TIMED UP AND GO:  Was the test performed?  No  Cognitive Function: 6CIT completed        08/30/2024    9:08 AM 08/27/2023    9:14 AM  6CIT Screen  What Year? 0 points 0 points  What month? 0 points 0 points  What time? 0 points 0 points  Count back from 20 0 points 0 points  Months in reverse 0 points 0 points  Repeat phrase 2 points 0 points  Total Score 2 points 0 points    Immunizations Immunization History  Administered Date(s) Administered   INFLUENZA, HIGH DOSE SEASONAL PF 12/19/2016, 11/05/2017, 09/17/2022   Influenza Split 08/26/2023   Influenza-Unspecified 12/19/2016, 11/05/2017, 08/26/2023   PFIZER Comirnaty(Gray Top)Covid-19 Tri-Sucrose Vaccine 01/20/2020, 02/10/2020, 09/28/2020, 05/29/2021   PFIZER(Purple Top)SARS-COV-2 Vaccination 01/20/2020, 02/10/2020   Pfizer Covid-19 Vaccine Bivalent Booster 1yrs & up 10/17/2021   Pfizer(Comirnaty)Fall Seasonal Vaccine 12 years and older 09/30/2022, 08/26/2023   Pneumococcal Conjugate-13 08/30/2015   Pneumococcal Polysaccharide-23 02/02/2019   Td 04/16/2023   Tdap 08/16/2007   Zoster Recombinant(Shingrix) 07/04/2020, 11/30/2020   Zoster, Live 01/28/2010    Screening Tests Health Maintenance  Topic Date Due   INFLUENZA VACCINE  07/29/2024   COVID-19 Vaccine (10 - Pfizer risk 2024-25 season) 08/29/2024   Medicare Annual Wellness (AWV)  08/30/2025   DTaP/Tdap/Td (3 - Td or Tdap) 04/15/2033   Pneumococcal Vaccine: 50+ Years  Completed   DEXA SCAN  Completed   Hepatitis C Screening  Completed   Zoster Vaccines- Shingrix  Completed   HPV VACCINES  Aged Out   Meningococcal B Vaccine  Aged Out   Colonoscopy  Discontinued    Health Maintenance  Health Maintenance Due  Topic Date Due   INFLUENZA VACCINE  07/29/2024   COVID-19 Vaccine (10 -  Pfizer risk 2024-25 season) 08/29/2024   Health Maintenance Items Addressed:  08/30/2024  Additional Screening:  Vision Screening: Recommended annual ophthalmology exams for early detection of glaucoma and other disorders of the eye. Would you like a referral to an eye doctor? No    Dental Screening: Recommended annual dental exams for proper oral hygiene  Community Resource Referral / Chronic Care Management: CRR required this visit?  No   CCM required this visit?  No   Plan:    I have personally reviewed and noted the following in the patient's chart:   Medical and social history Use of alcohol, tobacco or illicit drugs  Current medications and supplements including opioid prescriptions. Patient is not currently taking opioid prescriptions. Functional ability and status Nutritional status Physical activity Advanced directives List of other physicians Hospitalizations, surgeries, and ER visits in previous 12 months Vitals Screenings to include cognitive, depression, and falls Referrals and appointments  In addition, I have reviewed and discussed with patient certain preventive protocols, quality metrics, and best practice recommendations. A written personalized care plan for preventive services as well as general preventive health recommendations were provided to patient.   Verdie CHRISTELLA Saba, CMA  08/30/2024   After Visit Summary: (MyChart) Due to this being a telephonic visit, the after visit summary with patients personalized plan was offered to patient via MyChart   Notes: Nothing significant to report at this time.

## 2024-08-30 NOTE — Patient Instructions (Signed)
 Tammy Bowen , Thank you for taking time out of your busy schedule to complete your Annual Wellness Visit with me. I enjoyed our conversation and look forward to speaking with you again next year. I, as well as your care team,  appreciate your ongoing commitment to your health goals. Please review the following plan we discussed and let me know if I can assist you in the future. Your Game plan/ To Do List    Referrals: If you haven't heard from the office you've been referred to, please reach out to them at the phone provided.   Follow up Visits: We will see or speak with you next year for your Next Medicare AWV with our clinical staff Have you seen your provider in the last 6 months (3 months if uncontrolled diabetes)? Yes  Clinician Recommendations:  Aim for 30 minutes of exercise or brisk walking, 6-8 glasses of water, and 5 servings of fruits and vegetables each day.       This is a list of the screenings recommended for you:  Health Maintenance  Topic Date Due   Flu Shot  07/29/2024   COVID-19 Vaccine (10 - Pfizer risk 2024-25 season) 08/29/2024   Medicare Annual Wellness Visit  08/30/2025   DTaP/Tdap/Td vaccine (3 - Td or Tdap) 04/15/2033   Pneumococcal Vaccine for age over 41  Completed   DEXA scan (bone density measurement)  Completed   Hepatitis C Screening  Completed   Zoster (Shingles) Vaccine  Completed   HPV Vaccine  Aged Out   Meningitis B Vaccine  Aged Out   Colon Cancer Screening  Discontinued    Advanced directives: (Copy Requested) Please bring a copy of your health care power of attorney and living will to the office to be added to your chart at your convenience. You can mail to [facility] [address] [place], [place] [address] or email to [email or web address] Advance Care Planning is important because it:  [x]  Makes sure you receive the medical care that is consistent with your values, goals, and preferences  [x]  It provides guidance to  your family and loved ones and reduces their decisional burden about whether or not they are making the right decisions based on your wishes.  Follow the link provided in your after visit summary or read over the paperwork we have mailed to you to help you started getting your Advance Directives in place. If you need assistance in completing these, please reach out to us  so that we can help you!

## 2024-09-26 ENCOUNTER — Ambulatory Visit (INDEPENDENT_AMBULATORY_CARE_PROVIDER_SITE_OTHER): Admitting: Obstetrics and Gynecology

## 2024-09-26 ENCOUNTER — Encounter: Payer: Self-pay | Admitting: Obstetrics and Gynecology

## 2024-09-26 VITALS — BP 122/86 | HR 80

## 2024-09-26 DIAGNOSIS — N993 Prolapse of vaginal vault after hysterectomy: Secondary | ICD-10-CM

## 2024-09-26 DIAGNOSIS — Z4689 Encounter for fitting and adjustment of other specified devices: Secondary | ICD-10-CM

## 2024-09-26 MED ORDER — ESTRADIOL 0.1 MG/GM VA CREA: TOPICAL_CREAM | VAGINAL | 1 refills | Status: AC

## 2024-09-26 NOTE — Progress Notes (Signed)
 GYNECOLOGY  VISIT   HPI: 78 y.o.   Married  Caucasian female   [id number] with Patient's last menstrual period was 12/30/1983.   here for: Pessary check      Received 75 mm incontinence dish with support at her visit 06/22/24.  She has a 75 mm and 80 mm incontinence dishes from [place], which are both too large.    Now using an older incontinence dish pessary. It is supporting her prolapse well and she is able to place and remove it ok.  Stress incontinence occurs but is manageable.  GYNECOLOGIC HISTORY: Patient's last menstrual period was 12/30/1983. Contraception:  Hyst  Menopausal hormone therapy:  Estrace   Last 2 paps:  Years ago History of abnormal Pap or positive HPV:  no Mammogram:  12/01/23 Breast density Cat B, BIRADS Cat 1 neg          OB History     Gravida  3   Para  2   Term      Preterm      AB  1   Living  1      SAB      IAB  1   Ectopic      Multiple      Live Births                 Patient Active Problem List   Diagnosis Date Noted   Pill dysphagia 07/06/2024   Gastroesophageal reflux disease without esophagitis 07/06/2024   Dyslipidemia, goal LDL below 130 04/16/2023   Chronic heart failure with preserved ejection fraction (HCC) 02/06/2023   Grade 1 out of 6 intensity murmur 01/12/2023   Primary hypertension 01/12/2023   Stage 3b chronic kidney disease (HCC) 01/12/2023   Basal cell carcinoma of nose 09/30/2022   Need for influenza vaccination 12/19/2016   Low bone mass 04/25/2016   Thyroid  nodule 04/25/2016   Gastro-esophageal reflux disease with esophagitis 04/24/2016   Osteoarthritis 04/24/2016    Past Medical History:  Diagnosis Date   Cystocele 06/15/2000   Esophageal stenosis 2025   endoscopy with Dr. Rollin   Hypertension    MVP (mitral valve prolapse)    Osteoporosis    bilateral hips   Thyroid  nodule 05/06/2016   right and left thyroid  nodule     Past Surgical History:  Procedure Laterality Date   back fusion   2004, 1990   lumbar   BREAST BIOPSY Left    ESOPHAGEAL DILATION  2025   Dr. Rollin   TUBAL LIGATION     BTSP   VAGINAL HYSTERECTOMY  08/30/1979   Prolapsa    Current Outpatient Medications  Medication Sig Dispense Refill   amLODipine  (NORVASC ) 5 MG tablet Take 1 tablet (5 mg total) by mouth daily. 90 tablet 1   B Complex Vitamins (VITAMIN B COMPLEX) TABS Take 1 tablet by mouth daily.     benzonatate  (TESSALON ) 200 MG capsule Take 1 capsule (200 mg total) by mouth 2 (two) times daily as needed for cough. 20 capsule 0   calcium  carbonate (SUPER CALCIUM ) 1500 (600 Ca) MG TABS tablet Take 1 tablet by mouth daily.     cholecalciferol 25 MCG (1000 UT) tablet Take 1,000 Units by mouth.     estradiol  (ESTRACE ) 0.1 MG/GM vaginal cream Use 1 g vaginally two times per week.   Also place a pea size amount to the urethra 3 - 4 times per week.     losartan  (COZAAR ) 100 MG tablet Take 1  tablet (100 mg total) by mouth daily. 90 tablet 0   loteprednol (LOTEMAX) 0.5 % ophthalmic suspension as needed.     rosuvastatin  (CRESTOR ) 5 MG tablet Take 1 tablet (5 mg total) by mouth daily. 90 tablet 1   Vonoprazan Fumarate  (VOQUEZNA ) 10 MG TABS Take 1 tablet by mouth daily. (Patient not taking: Reported on 09/26/2024) 90 tablet 1   No current facility-administered medications for this visit.     ALLERGIES: Thimerosal (thiomersal)  Family History  Problem Relation Age of Onset   Stroke Mother    Thyroid  disease Sister    Hypertension Maternal Aunt    Breast cancer Niece 49   Breast cancer Niece     Social History   Socioeconomic History   Marital status: Married    Spouse name: Not on file   Number of children: Not on file   Years of education: Not on file   Highest education level: Master's degree (e.g., MA, MS, MEng, MEd, MSW, MBA)  Occupational History   Not on file  Tobacco Use   Smoking status: Former    Types: Cigars    Quit date: 05/09/1973    Years since quitting: 51.4   Smokeless  tobacco: Never   Tobacco comments:    In college a little  Vaping Use   Vaping status: Never Used  Substance and Sexual Activity   Alcohol use: Yes    Alcohol/week: 7.0 standard drinks of alcohol    Types: 7 Glasses of wine per week    Comment: a glass of wine at night, occ beer   Drug use: No   Sexual activity: Not Currently    Partners: Male    Birth control/protection: Post-menopausal, Surgical    Comment: TVH  Other Topics Concern   Not on file  Social History Narrative   Married   Social Drivers of Health   Financial Resource Strain: Low Risk  (08/30/2024)   Overall Financial Resource Strain (CARDIA)    Difficulty of Paying Living Expenses: Not hard at all  Food Insecurity: No Food Insecurity (08/30/2024)   Hunger Vital Sign    Worried About Running Out of Food in the Last Year: Never true    Ran Out of Food in the Last Year: Never true  Transportation Needs: No Transportation Needs (08/30/2024)   PRAPARE - Administrator, Civil Service (Medical): No    Lack of Transportation (Non-Medical): No  Physical Activity: Sufficiently Active (08/30/2024)   Exercise Vital Sign    Days of Exercise per Week: 5 days    Minutes of Exercise per Session: 30 min  Recent Concern: Physical Activity - Insufficiently Active (07/04/2024)   Exercise Vital Sign    Days of Exercise per Week: 2 days    Minutes of Exercise per Session: 20 min  Stress: No Stress Concern Present (08/30/2024)   [employer] of Occupational Health - Occupational Stress Questionnaire    Feeling of Stress: Not at all  Social Connections: Socially Integrated (08/30/2024)   Social Connection and Isolation Panel    Frequency of Communication with Friends and Family: More than three times a week    Frequency of Social Gatherings with Friends and Family: More than three times a week    Attends Religious Services: More than 4 times per year    Active Member of [employer] or Organizations: Yes    Attends Tax inspector Meetings: More than 4 times per year    Marital Status: Married  Intimate  Partner Violence: Not At Risk (08/30/2024)   Humiliation, Afraid, Rape, and Kick questionnaire    Fear of Current or Ex-Partner: No    Emotionally Abused: No    Physically Abused: No    Sexually Abused: No    Review of Systems  All other systems reviewed and are negative.   PHYSICAL EXAMINATION:   BP 122/86 (BP Location: Left Arm, Patient Position: Sitting)   Pulse 80   LMP 12/30/1983   SpO2 98%     General appearance: alert, cooperative and appears stated age  Pelvic: External genitalia:  no lesions              Urethra:  normal appearing urethra with no masses, tenderness or lesions              Bartholins and Skenes: normal                 Vagina: minor erythema of the bilateral vaginal apices.  No frank ulceration.                 Cervix: absent                Bimanual Exam:  Uterus:  absent              Adnexa: no mass, fullness, tenderness             #70 incontinence dish removed, cleansed and replaced.   Chaperone was present for exam:  Kari HERO, CMA  ASSESSMENT:  Vaginal prolapse following hysterectomy.  Pessary maintenance.  Inflammation of the vaginal from pessary use.  Stress incontinence.   PLAN:  Increase vaginal estrogen to 1 gram 2 - 3 times per week and pea size amount to the urethra.  I discussed an Estring  as an alternative to vaginal estradiol  cream if desired.  Continue #70 incontinence dish.   FU in 4 - 6 months for next pessary check. Mammogram due in December, 2025.   17 min  total time was spent for this patient encounter, including preparation, face-to-face counseling with the patient, coordination of care, and documentation of the encounter.

## 2024-10-04 ENCOUNTER — Other Ambulatory Visit: Payer: Self-pay | Admitting: Cardiology

## 2024-10-06 ENCOUNTER — Telehealth: Payer: Self-pay | Admitting: Cardiology

## 2024-10-06 MED ORDER — LOSARTAN POTASSIUM 100 MG PO TABS: 100.0000 mg | ORAL_TABLET | Freq: Every day | ORAL | 2 refills | Status: AC

## 2024-10-06 NOTE — Telephone Encounter (Signed)
 Pt's medication was sent to pt's pharmacy as requested. Confirmation received.

## 2024-10-06 NOTE — Telephone Encounter (Signed)
*  STAT* If patient is at the pharmacy, call can be transferred to refill team.   1. Which medications need to be refilled? (please list name of each medication and dose if known)   losartan  (COZAAR ) 100 MG tablet     2. Would you like to learn more about the convenience, safety, & potential cost savings by using the [facility]? No     3. Are you open to using the [facility] (Type [facility]. No    4. Which pharmacy/location (including street and city if local pharmacy) is medication to be sent to? [facility] [place], [place] - [address]     5. Do they need a 30 day or 90 day supply? 90 day

## 2024-12-06 LAB — HM MAMMOGRAPHY

## 2024-12-08 ENCOUNTER — Encounter: Payer: Self-pay | Admitting: Obstetrics and Gynecology

## 2025-01-03 ENCOUNTER — Other Ambulatory Visit: Payer: Self-pay | Admitting: Internal Medicine

## 2025-01-03 DIAGNOSIS — I10 Essential (primary) hypertension: Secondary | ICD-10-CM

## 2025-01-06 ENCOUNTER — Other Ambulatory Visit: Payer: Self-pay | Admitting: Medical Genetics

## 2025-01-09 ENCOUNTER — Ambulatory Visit: Admitting: Internal Medicine

## 2025-01-09 ENCOUNTER — Ambulatory Visit: Payer: Self-pay | Admitting: Internal Medicine

## 2025-01-09 ENCOUNTER — Encounter: Payer: Self-pay | Admitting: Internal Medicine

## 2025-01-09 ENCOUNTER — Encounter: Payer: Self-pay | Admitting: *Deleted

## 2025-01-09 VITALS — BP 142/80 | HR 85 | Temp 98.3°F | Resp 16 | Ht 62.5 in | Wt 108.0 lb

## 2025-01-09 DIAGNOSIS — H9193 Unspecified hearing loss, bilateral: Secondary | ICD-10-CM | POA: Insufficient documentation

## 2025-01-09 DIAGNOSIS — I10 Essential (primary) hypertension: Secondary | ICD-10-CM

## 2025-01-09 DIAGNOSIS — E785 Hyperlipidemia, unspecified: Secondary | ICD-10-CM | POA: Diagnosis not present

## 2025-01-09 DIAGNOSIS — K219 Gastro-esophageal reflux disease without esophagitis: Secondary | ICD-10-CM

## 2025-01-09 DIAGNOSIS — I5032 Chronic diastolic (congestive) heart failure: Secondary | ICD-10-CM

## 2025-01-09 LAB — HEPATIC FUNCTION PANEL
ALT: 17 U/L (ref 3–35)
AST: 25 U/L (ref 5–37)
Albumin: 4.3 g/dL (ref 3.5–5.2)
Alkaline Phosphatase: 62 U/L (ref 39–117)
Bilirubin, Direct: 0.1 mg/dL (ref 0.1–0.3)
Total Bilirubin: 0.5 mg/dL (ref 0.2–1.2)
Total Protein: 8.3 g/dL (ref 6.0–8.3)

## 2025-01-09 LAB — LIPID PANEL
Cholesterol: 190 mg/dL (ref 28–200)
HDL: 74.2 mg/dL
LDL Cholesterol: 92 mg/dL (ref 10–99)
NonHDL: 115.71
Total CHOL/HDL Ratio: 3
Triglycerides: 118 mg/dL (ref 10.0–149.0)
VLDL: 23.6 mg/dL (ref 0.0–40.0)

## 2025-01-09 LAB — BASIC METABOLIC PANEL WITH GFR
BUN: 24 mg/dL — ABNORMAL HIGH (ref 6–23)
CO2: 31 meq/L (ref 19–32)
Calcium: 9.6 mg/dL (ref 8.4–10.5)
Chloride: 100 meq/L (ref 96–112)
Creatinine, Ser: 0.78 mg/dL (ref 0.40–1.20)
GFR: 72.85 mL/min
Glucose, Bld: 92 mg/dL (ref 70–99)
Potassium: 4.7 meq/L (ref 3.5–5.1)
Sodium: 138 meq/L (ref 135–145)

## 2025-01-09 LAB — CBC WITH DIFFERENTIAL/PLATELET
Basophils Absolute: 0 K/uL (ref 0.0–0.1)
Basophils Relative: 0.6 % (ref 0.0–3.0)
Eosinophils Absolute: 0.1 K/uL (ref 0.0–0.7)
Eosinophils Relative: 2.2 % (ref 0.0–5.0)
HCT: 37.7 % (ref 36.0–46.0)
Hemoglobin: 12.8 g/dL (ref 12.0–15.0)
Lymphocytes Relative: 36.9 % (ref 12.0–46.0)
Lymphs Abs: 1.5 K/uL (ref 0.7–4.0)
MCHC: 33.9 g/dL (ref 30.0–36.0)
MCV: 87.9 fl (ref 78.0–100.0)
Monocytes Absolute: 0.4 K/uL (ref 0.1–1.0)
Monocytes Relative: 9.2 % (ref 3.0–12.0)
Neutro Abs: 2.1 K/uL (ref 1.4–7.7)
Neutrophils Relative %: 51.1 % (ref 43.0–77.0)
Platelets: 251 K/uL (ref 150.0–400.0)
RBC: 4.29 Mil/uL (ref 3.87–5.11)
RDW: 13.5 % (ref 11.5–15.5)
WBC: 4.1 K/uL (ref 4.0–10.5)

## 2025-01-09 LAB — TSH: TSH: 3.41 u[IU]/mL (ref 0.35–5.50)

## 2025-01-09 MED ORDER — ROSUVASTATIN CALCIUM 5 MG PO TABS: 5.0000 mg | ORAL_TABLET | Freq: Every day | ORAL | 1 refills | Status: AC

## 2025-01-09 NOTE — Patient Instructions (Signed)
 Hypertension, Adult High blood pressure (hypertension) is when the force of blood pumping through the arteries is too strong. The arteries are the blood vessels that carry blood from the heart throughout the body. Hypertension forces the heart to work harder to pump blood and may cause arteries to become narrow or stiff. Untreated or uncontrolled hypertension can lead to a heart attack, heart failure, a stroke, kidney disease, and other problems. A blood pressure reading consists of a higher number over a lower number. Ideally, your blood pressure should be below 120/80. The first ("top") number is called the systolic pressure. It is a measure of the pressure in your arteries as your heart beats. The second ("bottom") number is called the diastolic pressure. It is a measure of the pressure in your arteries as the heart relaxes. What are the causes? The exact cause of this condition is not known. There are some conditions that result in high blood pressure. What increases the risk? Certain factors may make you more likely to develop high blood pressure. Some of these risk factors are under your control, including: Smoking. Not getting enough exercise or physical activity. Being overweight. Having too much fat, sugar, calories, or salt (sodium) in your diet. Drinking too much alcohol. Other risk factors include: Having a personal history of heart disease, diabetes, high cholesterol, or kidney disease. Stress. Having a family history of high blood pressure and high cholesterol. Having obstructive sleep apnea. Age. The risk increases with age. What are the signs or symptoms? High blood pressure may not cause symptoms. Very high blood pressure (hypertensive crisis) may cause: Headache. Fast or irregular heartbeats (palpitations). Shortness of breath. Nosebleed. Nausea and vomiting. Vision changes. Severe chest pain, dizziness, and seizures. How is this diagnosed? This condition is diagnosed by  measuring your blood pressure while you are seated, with your arm resting on a flat surface, your legs uncrossed, and your feet flat on the floor. The cuff of the blood pressure monitor will be placed directly against the skin of your upper arm at the level of your heart. Blood pressure should be measured at least twice using the same arm. Certain conditions can cause a difference in blood pressure between your right and left arms. If you have a high blood pressure reading during one visit or you have normal blood pressure with other risk factors, you may be asked to: Return on a different day to have your blood pressure checked again. Monitor your blood pressure at home for 1 week or longer. If you are diagnosed with hypertension, you may have other blood or imaging tests to help your health care provider understand your overall risk for other conditions. How is this treated? This condition is treated by making healthy lifestyle changes, such as eating healthy foods, exercising more, and reducing your alcohol intake. You may be referred for counseling on a healthy diet and physical activity. Your health care provider may prescribe medicine if lifestyle changes are not enough to get your blood pressure under control and if: Your systolic blood pressure is above 130. Your diastolic blood pressure is above 80. Your personal target blood pressure may vary depending on your medical conditions, your age, and other factors. Follow these instructions at home: Eating and drinking  Eat a diet that is high in fiber and potassium, and low in sodium, added sugar, and fat. An example of this eating plan is called the DASH diet. DASH stands for Dietary Approaches to Stop Hypertension. To eat this way: Eat  plenty of fresh fruits and vegetables. Try to fill one half of your plate at each meal with fruits and vegetables. Eat whole grains, such as whole-wheat pasta, brown rice, or whole-grain bread. Fill about one  fourth of your plate with whole grains. Eat or drink low-fat dairy products, such as skim milk or low-fat yogurt. Avoid fatty cuts of meat, processed or cured meats, and poultry with skin. Fill about one fourth of your plate with lean proteins, such as fish, chicken without skin, beans, eggs, or tofu. Avoid pre-made and processed foods. These tend to be higher in sodium, added sugar, and fat. Reduce your daily sodium intake. Many people with hypertension should eat less than 1,500 mg of sodium a day. Do not drink alcohol if: Your health care provider tells you not to drink. You are pregnant, may be pregnant, or are planning to become pregnant. If you drink alcohol: Limit how much you have to: 0-1 drink a day for women. 0-2 drinks a day for men. Know how much alcohol is in your drink. In the [place] one drink equals one 12 oz bottle of beer (355 mL), one 5 oz glass of wine (148 mL), or one 1 oz glass of hard liquor (44 mL). Lifestyle  Work with your health care provider to maintain a healthy body weight or to lose weight. Ask what an ideal weight is for you. Get at least 30 minutes of exercise that causes your heart to beat faster (aerobic exercise) most days of the week. Activities may include walking, swimming, or biking. Include exercise to strengthen your muscles (resistance exercise), such as Pilates or lifting weights, as part of your weekly exercise routine. Try to do these types of exercises for 30 minutes at least 3 days a week. Do not use any products that contain nicotine or tobacco. These products include cigarettes, chewing tobacco, and vaping devices, such as e-cigarettes. If you need help quitting, ask your health care provider. Monitor your blood pressure at home as told by your health care provider. Keep all follow-up visits. This is important. Medicines Take over-the-counter and prescription medicines only as told by your health care provider. Follow directions carefully. Blood  pressure medicines must be taken as prescribed. Do not skip doses of blood pressure medicine. Doing this puts you at risk for problems and can make the medicine less effective. Ask your health care provider about side effects or reactions to medicines that you should watch for. Contact a health care provider if you: Think you are having a reaction to a medicine you are taking. Have headaches that keep coming back (recurring). Feel dizzy. Have swelling in your ankles. Have trouble with your vision. Get help right away if you: Develop a severe headache or confusion. Have unusual weakness or numbness. Feel faint. Have severe pain in your chest or abdomen. Vomit repeatedly. Have trouble breathing. These symptoms may be an emergency. Get help right away. Call 911. Do not wait to see if the symptoms will go away. Do not drive yourself to the hospital. Summary Hypertension is when the force of blood pumping through your arteries is too strong. If this condition is not controlled, it may put you at risk for serious complications. Your personal target blood pressure may vary depending on your medical conditions, your age, and other factors. For most people, a normal blood pressure is less than 120/80. Hypertension is treated with lifestyle changes, medicines, or a combination of both. Lifestyle changes include losing weight, eating a healthy,  low-sodium diet, exercising more, and limiting alcohol. This information is not intended to replace advice given to you by your health care provider. Make sure you discuss any questions you have with your health care provider. Document Revised: 10/22/2021 Document Reviewed: 10/22/2021 Elsevier Patient Education  2024 [employer].

## 2025-01-09 NOTE — Progress Notes (Signed)
 "  Subjective:  Patient ID: Tammy Bowen, female    DOB: 1946-05-30  Age: 79 y.o. MRN: [id number]  CC: Hypertension (Follow up for hypertension ) and Hyperlipidemia   HPI Tammy Bowen presents for f/up    Discussed the use of AI scribe software for clinical note transcription with the patient, who gave verbal consent to proceed.  History of Present Illness Tammy Bowen is a 79 year old female with hypertension and GERD who presents for medication management and follow-up on recent endoscopy results.  She has been on losartan  for hypertension, with the dose increased to 100 mg by her cardiologist. She has been taking this dose for about a year and a half without experiencing weakness, dizziness, or lightheadedness. Her home blood pressure readings are typically around 122/73 mmHg, occasionally dropping to 108 mmHg, but she denies any symptoms of hypotension.  She recently underwent an endoscopy, which revealed a somewhat restricted throat that was subsequently dilated. This procedure has improved her ability to swallow pills, although she still avoids large ones. No weight loss, maintaining a weight of 105-106 pounds. The endoscopy showed no scarring related to GERD. Her throat clearing issue has improved with measures like chewing gum, gargling salt water, and staying hydrated. However, she occasionally experiences hoarseness and dryness, particularly at night, requiring her to drink water.  She wears hearing aids and notes a change in her hearing since a severe upper respiratory infection in late July or early August, which required antibiotics. She describes a persistent 'echo chamber' sensation in her ears since the infection resolved.     Outpatient Medications Prior to Visit  Medication Sig Dispense Refill   amLODipine  (NORVASC ) 5 MG tablet Take 1 tablet (5 mg total) by mouth daily. 90 tablet 1   B Complex Vitamins (VITAMIN B COMPLEX) TABS Take 1 tablet by  mouth daily.     calcium  carbonate (SUPER CALCIUM ) 1500 (600 Ca) MG TABS tablet Take 1 tablet by mouth daily.     cholecalciferol 25 MCG (1000 UT) tablet Take 1,000 Units by mouth.     estradiol  (ESTRACE ) 0.1 MG/GM vaginal cream Use 1 g vaginally two or three times per week as needed to maintain symptom relief.  Place a pea size amount to the urethra with each use of the cream. 42.5 g 1   losartan  (COZAAR ) 100 MG tablet Take 1 tablet (100 mg total) by mouth daily. 90 tablet 2   loteprednol (LOTEMAX) 0.5 % ophthalmic suspension as needed.     benzonatate  (TESSALON ) 200 MG capsule Take 1 capsule (200 mg total) by mouth 2 (two) times daily as needed for cough. 20 capsule 0   rosuvastatin  (CRESTOR ) 5 MG tablet Take 1 tablet (5 mg total) by mouth daily. 90 tablet 1   Vonoprazan Fumarate  (VOQUEZNA ) 10 MG TABS Take 1 tablet by mouth daily. (Patient not taking: Reported on 01/09/2025) 90 tablet 1   No facility-administered medications prior to visit.    ROS Review of Systems  Constitutional:  Negative for appetite change, chills, diaphoresis, fatigue and fever.  HENT:  Positive for hearing loss. Negative for facial swelling, sinus pressure and sore throat.   Eyes: Negative.   Respiratory: Negative.  Negative for cough, chest tightness, shortness of breath and wheezing.   Cardiovascular:  Negative for chest pain, palpitations and leg swelling.  Gastrointestinal:  Negative for abdominal pain, constipation, diarrhea, nausea and vomiting.  Endocrine: Negative.   Genitourinary: Negative.  Negative for difficulty urinating and dysuria.  Musculoskeletal: Negative.  Negative for arthralgias and myalgias.  Skin: Negative.   Neurological:  Negative for dizziness, weakness and light-headedness.  Hematological:  Negative for adenopathy. Does not bruise/bleed easily.  Psychiatric/Behavioral: Negative.      Objective:  BP (!) 142/80 (BP Location: Left Arm, Patient Position: Sitting, Cuff Size: Normal)    Pulse 85   Temp 98.3 F (36.8 C) (Oral)   Resp 16   Ht 5' 2.5 (1.588 m)   Wt 108 lb (49 kg)   LMP 12/30/1983   SpO2 99%   BMI 19.44 kg/m   BP Readings from Last 3 Encounters:  01/09/25 (!) 142/80  09/26/24 122/86  07/29/24 128/60    Wt Readings from Last 3 Encounters:  01/09/25 108 lb (49 kg)  08/30/24 107 lb (48.5 kg)  07/29/24 107 lb (48.5 kg)    Physical Exam Vitals reviewed.  Constitutional:      Appearance: Normal appearance.  HENT:     Right Ear: Tympanic membrane, ear canal and external ear normal. Decreased hearing noted. No middle ear effusion. There is no impacted cerumen. Tympanic membrane is not injected.     Left Ear: Tympanic membrane, ear canal and external ear normal. Decreased hearing noted.  No middle ear effusion. There is no impacted cerumen. Tympanic membrane is not injected.     Nose: Nose normal.     Mouth/Throat:     Mouth: Mucous membranes are moist.  Eyes:     General: No scleral icterus.    Conjunctiva/sclera: Conjunctivae normal.  Cardiovascular:     Rate and Rhythm: Normal rate and regular rhythm.     Heart sounds: Murmur heard.     Systolic murmur is present with a grade of 1/6.     No friction rub. No gallop.  Pulmonary:     Effort: Pulmonary effort is normal.     Breath sounds: No stridor. No wheezing, rhonchi or rales.  Abdominal:     General: Abdomen is flat.     Palpations: There is no mass.     Tenderness: There is no abdominal tenderness. There is no guarding.     Hernia: No hernia is present.  Musculoskeletal:        General: Normal range of motion.     Cervical back: Neck supple.     Right lower leg: No edema.     Left lower leg: No edema.  Lymphadenopathy:     Cervical: No cervical adenopathy.  Skin:    General: Skin is warm and dry.  Neurological:     General: No focal deficit present.     Mental Status: She is alert.  Psychiatric:        Mood and Affect: Mood normal.        Behavior: Behavior normal.     Lab  Results  Component Value Date   WBC 4.1 01/09/2025   HGB 12.8 01/09/2025   HCT 37.7 01/09/2025   PLT 251.0 01/09/2025   GLUCOSE 92 01/09/2025   CHOL 190 01/09/2025   TRIG 118.0 01/09/2025   HDL 74.20 01/09/2025   LDLCALC 92 01/09/2025   ALT 17 01/09/2025   AST 25 01/09/2025   NA 138 01/09/2025   K 4.7 01/09/2025   CL 100 01/09/2025   CREATININE 0.78 01/09/2025   BUN 24 (H) 01/09/2025   CO2 31 01/09/2025   TSH 3.41 01/09/2025    CT CARDIAC SCORING ([facility] LOCATIONS ONLY) Result Date: 02/03/2024 CLINICAL DATA:  79 year old Caucasian female under evaluation for  coronary artery disease. * Tracking Code: FCC * EXAM: CT CARDIAC CORONARY ARTERY CALCIUM  SCORE TECHNIQUE: Non-contrast imaging through the heart was performed using prospective ECG gating. Image post processing was performed on an independent workstation, allowing for quantitative analysis of the heart and coronary arteries. Note that this exam targets the heart and the chest was not imaged in its entirety. COMPARISON:  No priors. FINDINGS: CORONARY CALCIUM  SCORES: Left Main: 0 LAD: 0 LCx: 0 RCA/PDA: 0 Total Agatston Score: 0 MESA database percentile: N/A AORTA MEASUREMENTS: Ascending Aorta: 3.4 cm Descending Aorta:2.5 cm OTHER FINDINGS: Atherosclerotic calcifications are noted in the thoracic aorta. Within the visualized portions of the thorax there are no large suspicious appearing pulmonary nodules or masses, there is no acute consolidative airspace disease, no pleural effusions, no pneumothorax and no lymphadenopathy. However, there are some scattered areas of mild cylindrical bronchiectasis with thickening of the peribronchovascular interstitium and regional architectural distortion most evident throughout the anterior aspects of the lungs bilaterally, with some associated peribronchovascular micro nodularity. Visualized portions of the upper abdomen are unremarkable. There are no aggressive appearing lytic or blastic lesions noted in the  visualized portions of the skeleton. IMPRESSION: 1. Patient's total coronary artery calcium  score is 0 which indicates a very low (but non-zero) risk of significant coronary artery atherosclerosis. 2.  Aortic Atherosclerosis (ICD10-I70.0). 3. 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 Outpatient referral to Pulmonology for further clinical evaluation is suggested. Electronically Signed   By: Toribio Aye M.D.   On: 02/03/2024 07:17    Assessment & Plan:   Primary hypertension- BP is well controlled. -     CBC with Differential/Platelet; Future -     Basic metabolic panel with GFR; Future -     TSH; Future  Gastroesophageal reflux disease without esophagitis -     CBC with Differential/Platelet; Future  Dyslipidemia, goal LDL below 130- LDL goal achieved. Doing well on the statin  -     Lipid panel; Future -     TSH; Future -     Hepatic function panel; Future -     Rosuvastatin  Calcium ; Take 1 tablet (5 mg total) by mouth daily.  Dispense: 90 tablet; Refill: 1  Chronic heart failure with preserved ejection fraction (HCC)- No signs of fluid excess.  Bilateral hearing loss, unspecified hearing loss type -     Ambulatory referral to ENT     Follow-up: Return in about 6 months (around 07/09/2025).  Debby Molt, MD "

## 2025-01-30 ENCOUNTER — Other Ambulatory Visit

## 2025-02-02 NOTE — Progress Notes (Unsigned)
 "  GYNECOLOGY  VISIT   HPI: 80 y.o.   Married  Caucasian female   [id number] with Patient's last menstrual period was 12/30/1983.   here for: Pessary check      GYNECOLOGIC HISTORY: Patient's last menstrual period was 12/30/1983. Contraception:  Hyst Menopausal hormone therapy:  Estrace   Last 2 paps:  years ago History of abnormal Pap or positive HPV:  no Mammogram:  12/06/24 Breast Density Cat B, BIRADS Cat 1 neg         OB History     Gravida  3   Para  2   Term      Preterm      AB  1   Living  1      SAB      IAB  1   Ectopic      Multiple      Live Births                 Patient Active Problem List   Diagnosis Date Noted   Bilateral hearing loss 01/09/2025   Gastroesophageal reflux disease without esophagitis 07/06/2024   Dyslipidemia, goal LDL below 130 04/16/2023   Chronic heart failure with preserved ejection fraction (HCC) 02/06/2023   Grade 1 out of 6 intensity murmur 01/12/2023   Primary hypertension 01/12/2023   Stage 3b chronic kidney disease (HCC) 01/12/2023   Basal cell carcinoma of nose 09/30/2022   Need for influenza vaccination 12/19/2016   Low bone mass 04/25/2016   Thyroid  nodule 04/25/2016   Gastro-esophageal reflux disease with esophagitis 04/24/2016   Osteoarthritis 04/24/2016    Past Medical History:  Diagnosis Date   Cystocele 06/15/2000   Esophageal stenosis 2025   endoscopy with Dr. Rollin   Hypertension    MVP (mitral valve prolapse)    Osteoporosis    bilateral hips   Thyroid  nodule 05/06/2016   right and left thyroid  nodule     Past Surgical History:  Procedure Laterality Date   back fusion  2004, 1990   lumbar   BREAST BIOPSY Left    ESOPHAGEAL DILATION  2025   Dr. Rollin   TUBAL LIGATION     BTSP   VAGINAL HYSTERECTOMY  08/30/1979   Prolapsa    Current Outpatient Medications  Medication Sig Dispense Refill   amLODipine  (NORVASC ) 5 MG tablet Take 1 tablet (5 mg total) by mouth daily. 90 tablet 1   B  Complex Vitamins (VITAMIN B COMPLEX) TABS Take 1 tablet by mouth daily.     calcium  carbonate (SUPER CALCIUM ) 1500 (600 Ca) MG TABS tablet Take 1 tablet by mouth daily.     cholecalciferol 25 MCG (1000 UT) tablet Take 1,000 Units by mouth.     estradiol  (ESTRACE ) 0.1 MG/GM vaginal cream Use 1 g vaginally two or three times per week as needed to maintain symptom relief.  Place a pea size amount to the urethra with each use of the cream. 42.5 g 1   losartan  (COZAAR ) 100 MG tablet Take 1 tablet (100 mg total) by mouth daily. 90 tablet 2   loteprednol (LOTEMAX) 0.5 % ophthalmic suspension as needed.     rosuvastatin  (CRESTOR ) 5 MG tablet Take 1 tablet (5 mg total) by mouth daily. 90 tablet 1   No current facility-administered medications for this visit.     ALLERGIES: Thimerosal (thiomersal)  Family History  Problem Relation Age of Onset   Stroke Mother    Thyroid  disease Sister    Hypertension Maternal Aunt  Breast cancer Niece 69   Breast cancer Niece     Social History   Socioeconomic History   Marital status: Married    Spouse name: Not on file   Number of children: Not on file   Years of education: Not on file   Highest education level: Master's degree (e.g., MA, MS, MEng, MEd, MSW, MBA)  Occupational History   Not on file  Tobacco Use   Smoking status: Former    Types: Cigars    Quit date: 05/09/1973    Years since quitting: 51.7   Smokeless tobacco: Never   Tobacco comments:    In college a little  Vaping Use   Vaping status: Never Used  Substance and Sexual Activity   Alcohol use: Yes    Alcohol/week: 7.0 standard drinks of alcohol    Types: 7 Glasses of wine per week    Comment: a glass of wine at night, occ beer   Drug use: No   Sexual activity: Not Currently    Partners: Male    Birth control/protection: Post-menopausal, Surgical    Comment: TVH  Other Topics Concern   Not on file  Social History Narrative   Married   Social Drivers of Health    Tobacco Use: Medium Risk (01/09/2025)   Patient History    Smoking Tobacco Use: Former    Smokeless Tobacco Use: Never    Passive Exposure: Not on file  Financial Resource Strain: Low Risk (01/05/2025)   Overall Financial Resource Strain (CARDIA)    Difficulty of Paying Living Expenses: Not hard at all  Food Insecurity: No Food Insecurity (01/05/2025)   Epic    Worried About Radiation Protection Practitioner of Food in the Last Year: Never true    Ran Out of Food in the Last Year: Never true  Transportation Needs: No Transportation Needs (01/05/2025)   Epic    Lack of Transportation (Medical): No    Lack of Transportation (Non-Medical): No  Physical Activity: Insufficiently Active (01/05/2025)   Exercise Vital Sign    Days of Exercise per Week: 3 days    Minutes of Exercise per Session: 30 min  Stress: No Stress Concern Present (01/05/2025)   [employer] of Occupational Health - Occupational Stress Questionnaire    Feeling of Stress: Not at all  Social Connections: Socially Integrated (01/05/2025)   Social Connection and Isolation Panel    Frequency of Communication with Friends and Family: More than three times a week    Frequency of Social Gatherings with Friends and Family: More than three times a week    Attends Religious Services: More than 4 times per year    Active Member of Clubs or Organizations: Yes    Attends Banker Meetings: More than 4 times per year    Marital Status: Married  Catering Manager Violence: Not At Risk (08/30/2024)   Epic    Fear of Current or Ex-Partner: No    Emotionally Abused: No    Physically Abused: No    Sexually Abused: No  Depression (PHQ2-9): Low Risk (08/30/2024)   Depression (PHQ2-9)    PHQ-2 Score: 0  Alcohol Screen: Low Risk (01/05/2025)   Alcohol Screen    Last Alcohol Screening Score (AUDIT): 3  Housing: Low Risk (01/05/2025)   Epic    Unable to Pay for Housing in the Last Year: No    Number of Times Moved in the Last Year: 0    Homeless in the  Last Year: No  Utilities:  Not At Risk (08/30/2024)   Epic    Threatened with loss of utilities: No  Health Literacy: Adequate Health Literacy (08/30/2024)   B1300 Health Literacy    Frequency of need for help with medical instructions: Never    Review of Systems  PHYSICAL EXAMINATION:   LMP 12/30/1983     General appearance: alert, cooperative and appears stated age Head: Normocephalic, without obvious abnormality, atraumatic Neck: no adenopathy, supple, symmetrical, trachea midline and thyroid  normal to inspection and palpation Lungs: clear to auscultation bilaterally Breasts: normal appearance, no masses or tenderness, No nipple retraction or dimpling, No nipple discharge or bleeding, No axillary or supraclavicular adenopathy Heart: regular rate and rhythm Abdomen: soft, non-tender, no masses,  no organomegaly Extremities: extremities normal, atraumatic, no cyanosis or edema Skin: Skin color, texture, turgor normal. No rashes or lesions Lymph nodes: Cervical, supraclavicular, and axillary nodes normal. No abnormal inguinal nodes palpated Neurologic: Grossly normal  Pelvic: External genitalia:  no lesions              Urethra:  normal appearing urethra with no masses, tenderness or lesions              Bartholins and Skenes: normal                 Vagina: normal appearing vagina with normal color and discharge, no lesions              Cervix: no lesions                Bimanual Exam:  Uterus:  normal size, contour, position, consistency, mobility, non-tender              Adnexa: no mass, fullness, tenderness              Rectal exam: {yes no:314532}.  Confirms.              Anus:  normal sphincter tone, no lesions  Chaperone was present for exam:  {BSCHAPERONE:31226::Emily F, CMA}  ASSESSMENT:    PLAN:    {LABS (Optional):23779}  ***  total time was spent for this patient encounter, including preparation, face-to-face counseling with the patient, coordination of care,  and documentation of the encounter.    "

## 2025-02-06 ENCOUNTER — Ambulatory Visit: Admitting: Obstetrics and Gynecology

## 2025-02-08 ENCOUNTER — Institutional Professional Consult (permissible substitution) (INDEPENDENT_AMBULATORY_CARE_PROVIDER_SITE_OTHER): Admitting: Otolaryngology

## 2025-02-13 ENCOUNTER — Other Ambulatory Visit

## 2025-07-10 ENCOUNTER — Ambulatory Visit: Admitting: Internal Medicine

## 2025-08-31 ENCOUNTER — Ambulatory Visit
